# Patient Record
Sex: Male | Born: 1945 | Race: White | Hispanic: No | Marital: Married
Health system: Midwestern US, Community
[De-identification: ages and names within clinical notes are randomized; demographics above are authoritative.]

## PROBLEM LIST (undated history)

## (undated) DIAGNOSIS — I480 Paroxysmal atrial fibrillation: Principal | ICD-10-CM

## (undated) DIAGNOSIS — I4821 Permanent atrial fibrillation: Principal | ICD-10-CM

## (undated) DIAGNOSIS — Z79899 Other long term (current) drug therapy: Secondary | ICD-10-CM

## (undated) DIAGNOSIS — E7800 Pure hypercholesterolemia, unspecified: Secondary | ICD-10-CM

## (undated) DIAGNOSIS — Z136 Encounter for screening for cardiovascular disorders: Secondary | ICD-10-CM

## (undated) DIAGNOSIS — Z0000 Encounter for general adult medical examination without abnormal findings: Secondary | ICD-10-CM

---

## 2014-06-18 ENCOUNTER — Encounter

## 2014-06-23 ENCOUNTER — Encounter

## 2014-06-23 LAB — TSH: TSH: 2.14 (ref 0.358–3.74)

## 2014-06-23 LAB — COMPREHENSIVE METABOLIC PANEL
ALT: 29 (ref 12–78)
AST: 27 (ref 0–31)
Albumin,Serum: 4.2 (ref 3.4–5.0)
Albumin/Globulin Ratio: 1.3 (ref 1.0–2.5)
Alkaline Phosphatase: 79 (ref 45–117)
Anion Gap: 5 — ABNORMAL LOW (ref 6–16)
BUN/Creatinine Ratio: 17.1 (ref 6.0–20.0)
BUN: 15 (ref 7–25)
CO2: 31 (ref 21–31)
Calcium: 9.2 (ref 8.2–10.5)
Chloride: 102 (ref 98–109)
Creatinine: 0.88 (ref 0.60–1.50)
GFR African American: >60
GFR Non-African American: >60
Globulin: 3.3 (ref 2.4–4.1)
Glucose: 102 — ABNORMAL HIGH (ref 70–100)
Potassium: 5 (ref 3.5–5.0)
Sodium: 138 (ref 135–145)
Total Bilirubin: 0.6 (ref 0.3–1.0)
Total Protein: 7.5 (ref 6.4–8.3)

## 2014-06-23 LAB — LIPID PANEL
Cholesterol, Total: 194 (ref 100–199)
HDL: 60 (ref 40–59)
LDL Calculated: 118 (ref 0–129)
Triglycerides: 80 (ref 40–149)

## 2014-06-23 LAB — PSA SCREENING: PSA: 0.75 (ref 0.00–4.00)

## 2014-06-23 LAB — SEDIMENTATION RATE: Sed Rate: 22 — ABNORMAL HIGH (ref 0–10)

## 2014-06-23 NOTE — Progress Notes (Signed)
SUBJECTIVE:    George Ferguson   68 y.o.   male    Chief Complaint   Patient presents with   ??? Annual Exam     Wants left foot checked. Pt states he does not need refills today.        HPI    JXB:JYNWGNHPI:Annual wellness visit.     Past Medical History   Diagnosis Date   ??? Hearing aid consultation      Due to otoscleosis,AS   ??? Atrial fibrillation (HCC)    ??? Gout    ??? Pure hypercholesterolemia    ??? Diverticulosis    ??? DJD (degenerative joint disease)      knees      Family History   Problem Relation Age of Onset   ??? High Blood Pressure Mother    ??? Stroke Mother    ??? Cancer Father    ??? Heart Disease Sister       History     Social History   ??? Marital Status: Married     Spouse Name: N/A     Number of Children: N/A   ??? Years of Education: N/A     Occupational History   ??? Not on file.     Social History Main Topics   ??? Smoking status: Former Smoker   ??? Smokeless tobacco: Not on file   ??? Alcohol Use: Not on file   ??? Drug Use: Not on file   ??? Sexual Activity: Not on file     Other Topics Concern   ??? Not on file     Social History Narrative     Past Surgical History   Procedure Laterality Date   ??? Colonoscopy  2012     No Known Allergies   Immunization History   Administered Date(s) Administered   ??? Pneumococcal Polysaccharide (Pneumovax23) 06/13/2010        Review of Systems   Constitutional: Negative for activity change, appetite change and fatigue.   HENT: Negative for congestion, ear pain, hearing loss, sinus pressure, sore throat and trouble swallowing.    Eyes: Negative for discharge, redness and visual disturbance.   Respiratory: Negative for apnea, cough, shortness of breath and wheezing.    Cardiovascular: Negative for chest pain, palpitations and leg swelling.   Gastrointestinal: Negative for nausea, vomiting, abdominal pain, diarrhea, constipation, blood in stool and abdominal distention.   Endocrine: Negative for cold intolerance, heat intolerance, polydipsia, polyphagia and polyuria.   Genitourinary: Negative for  dysuria, urgency, frequency and hematuria.   Musculoskeletal: Negative for myalgias, back pain, joint swelling and arthralgias.   Skin: Negative for color change, rash and wound.   Allergic/Immunologic: Negative.    Neurological: Negative for dizziness, tremors, light-headedness, numbness and headaches.   Hematological: Negative for adenopathy. Does not bruise/bleed easily.   Psychiatric/Behavioral: Negative for confusion, sleep disturbance and dysphoric mood. The patient is not nervous/anxious.        OBJECTIVE:  BP 151/82    Pulse 61    Ht 5\' 8"  (1.727 m)    Wt 180 lb (81.647 kg)    BMI 27.38 kg/m2      Physical Exam   Constitutional: He is oriented to person, place, and time. He appears well-developed and well-nourished.   HENT:   Head: Normocephalic.   Right Ear: External ear normal.   Left Ear: External ear normal.   Nose: Nose normal.   Mouth/Throat: Oropharynx is clear and moist.   Eyes: Conjunctivae and EOM  are normal. Pupils are equal, round, and reactive to light. No scleral icterus.   Neck: Normal range of motion. Neck supple. No JVD present. No tracheal deviation present. No thyromegaly present.   Cardiovascular: Normal rate, regular rhythm, normal heart sounds and intact distal pulses.  Exam reveals no friction rub.    No murmur heard.  Pulmonary/Chest: Effort normal and breath sounds normal. No respiratory distress. He has no wheezes. He has no rales.   Abdominal: Soft. Bowel sounds are normal. He exhibits no distension and no mass. There is no tenderness.   Musculoskeletal: Normal range of motion. He exhibits no edema or tenderness.   Lymphadenopathy:     He has no cervical adenopathy.   Neurological: He is alert and oriented to person, place, and time. He displays normal reflexes. No cranial nerve deficit. He exhibits normal muscle tone. Coordination normal.   Skin: Skin is warm and dry. No rash noted. No pallor.   Psychiatric: He has a normal mood and affect. His behavior is normal. Judgment and  thought content normal.   Nursing note and vitals reviewed.    No results found for this basename: lipds, cmp, tsh, psa         ASSESSMENT:    No diagnosis found.       PLAN:    No orders of the defined types were placed in this encounter.     No orders of the defined types were placed in this encounter.      No Follow-up on file.

## 2015-03-28 ENCOUNTER — Ambulatory Visit: Admit: 2015-03-28 | Discharge: 2015-03-28 | Payer: MEDICARE | Attending: Family

## 2015-03-28 DIAGNOSIS — J0110 Acute frontal sinusitis, unspecified: Secondary | ICD-10-CM

## 2015-03-28 MED ORDER — AMOXICILLIN-POT CLAVULANATE 875-125 MG PO TABS
875-125 MG | ORAL_TABLET | Freq: Two times a day (BID) | ORAL | Status: AC
Start: 2015-03-28 — End: 2015-04-07

## 2015-03-28 NOTE — Progress Notes (Signed)
Subjective:      Patient ID: George DallyJames M Ferguson is a 69 y.o. male.    HPI Here w/ c/o sinus pressure and dng, headache, fatigue, pressure in forehead and behind eyes, bloody mucousy dng from nose, sl cough, poor sleep.     Review of Systems    Objective:   Physical Exam   Constitutional: He appears well-developed and well-nourished.   HENT:   Right Ear: External ear normal.   Left Ear: External ear normal.   Mouth/Throat: Oropharynx is clear and moist.   Frontal sinus tender   Eyes: Conjunctivae are normal.   Neck: Neck supple.   Cardiovascular: Normal heart sounds.    Pulmonary/Chest: Effort normal and breath sounds normal.   Lymphadenopathy:     He has no cervical adenopathy.       Assessment:      1. Acute frontal sinusitis, recurrence not specified     2. Chronic atrial fibrillation (HCC)               Plan:     Orders Placed This Encounter   Medications   ??? amoxicillin-clavulanate (AUGMENTIN) 875-125 MG per tablet     Sig: Take 1 tablet by mouth 2 times daily for 10 days     Dispense:  20 tablet     Refill:  0     Visit Coumadin clinic [facility] end of week due to atbx use and coumadin.

## 2015-04-14 LAB — EJECTION FRACTION PERCENTAGE: Left Ventricular Ejection Fraction: 70 %

## 2015-04-28 MED ORDER — GELATIN ABSORBABLE 100 CM EX MISC
100 CM | CUTANEOUS | Status: DC
Start: 2015-04-28 — End: 2015-06-27

## 2015-04-28 NOTE — Telephone Encounter (Signed)
Pt's wife left message stating when pt bit his tongue a while ago and it wouldn't stop bleeding they were given Gelfoam. She states they have run out of the Gelfoam and would like a rx sent to [facility] to have on hand.   Not sure how to put that in medications.

## 2015-06-27 ENCOUNTER — Ambulatory Visit: Admit: 2015-06-27 | Discharge: 2015-06-27 | Payer: MEDICARE | Attending: Family Medicine

## 2015-06-27 ENCOUNTER — Encounter

## 2015-06-27 DIAGNOSIS — Z0000 Encounter for general adult medical examination without abnormal findings: Secondary | ICD-10-CM

## 2015-06-27 LAB — LIPID PANEL
Chol/HDL Ratio: 3 NA
Cholesterol: 219 mg/dL — AB (ref <200)
HDL: 74 mg/dL — ABNORMAL HIGH (ref 40–59)
LDL Cholesterol: 133 mg/dL — AB (ref <100)
Triglycerides: 58 mg/dL (ref <150)

## 2015-06-27 LAB — PSA SCREENING: PSA: 0.629 ng/mL (ref <4.000)

## 2015-06-27 LAB — TSH: TSH: 1.59 uU/mL (ref 0.358–3.740)

## 2015-06-27 MED ORDER — COLCHICINE 0.6 MG PO TABS
0.6 MG | ORAL_TABLET | ORAL | 3 refills | Status: DC
Start: 2015-06-27 — End: 2016-07-13

## 2015-06-27 NOTE — Progress Notes (Signed)
SUBJECTIVE:    George Ferguson   69 y.o.   male    Chief Complaint   Patient presents with   ??? Annual Exam     colonoscopy 2015   ??? Gout     woke up with rt big toe in pain and swollen/discuss Allopurinol script        HPI    HPI: Annual wellness visit.    Past Medical History   Diagnosis Date   ??? Atrial fibrillation (HCC)    ??? Diverticulosis    ??? DJD (degenerative joint disease)      knees   ??? Gout    ??? Hearing aid consultation      Due to otoscleosis,AS   ??? Pure hypercholesterolemia       Family History   Problem Relation Age of Onset   ??? High Blood Pressure Mother    ??? Stroke Mother    ??? Cancer Father    ??? Heart Disease Sister       Social History     Social History   ??? Marital status: Married     Spouse name: N/A   ??? Number of children: N/A   ??? Years of education: N/A     Occupational History   ??? Not on file.     Social History Main Topics   ??? Smoking status: Former Smoker   ??? Smokeless tobacco: Not on file   ??? Alcohol use Not on file   ??? Drug use: Not on file   ??? Sexual activity: Not on file     Other Topics Concern   ??? Not on file     Social History Narrative     Past Surgical History   Procedure Laterality Date   ??? Colonoscopy  2012     No Known Allergies   Immunization History   Administered Date(s) Administered   ??? Pneumococcal 13-valent Conjugate (Prevnar13) 06/23/2014   ??? Pneumococcal Polysaccharide (Pneumovax23) 06/13/2010, 06/13/2010   ??? Td 05/10/2005   ??? Tdap (Boostrix, Adacel) 06/27/2015   ??? Zoster 06/26/2010        Current Outpatient Prescriptions:   ???  colchicine (COLCRYS) 0.6 MG tablet, 1 bid, Disp: 60 tablet, Rfl: 3  ???  warfarin (COUMADIN) 4 MG tablet, 4 mg 6 dys a week and 6 mg once a week, Disp: , Rfl:   ???  aspirin 81 MG tablet, Take 81 mg by mouth daily, Disp: , Rfl:   ???  diltiazem (DILACOR XR) 180 MG XR capsule, Take 180 mg by mouth daily, Disp: , Rfl:   ???  digoxin (LANOXIN) 125 MCG tablet, Take 125 mcg by mouth daily, Disp: , Rfl:   ???  Glucosamine 500 MG CAPS, Take by mouth daily , Disp: ,  Rfl:   ???  Milk Thistle 175 MG CAPS, Take by mouth daily , Disp: , Rfl:     Review of Systems   Constitutional: Negative for activity change, appetite change and fatigue.   HENT: Negative for congestion, ear pain, hearing loss, sinus pressure, sore throat and trouble swallowing.    Eyes: Negative for discharge, redness and visual disturbance.   Respiratory: Negative for apnea, cough, shortness of breath and wheezing.    Cardiovascular: Negative for chest pain, palpitations and leg swelling.   Gastrointestinal: Negative for abdominal distention, abdominal pain, blood in stool, constipation, diarrhea, nausea and vomiting.   Endocrine: Negative for cold intolerance, heat intolerance, polydipsia, polyphagia and polyuria.   Genitourinary: Negative for  dysuria, frequency, hematuria and urgency.   Musculoskeletal: Negative for arthralgias, back pain, joint swelling and myalgias.   Skin: Negative for color change, rash and wound.   Allergic/Immunologic: Negative.    Neurological: Negative for dizziness, tremors, light-headedness, numbness and headaches.   Hematological: Negative for adenopathy. Does not bruise/bleed easily.   Psychiatric/Behavioral: Negative for confusion, dysphoric mood and sleep disturbance. The patient is not nervous/anxious.        OBJECTIVE:  Visit Vitals   ??? BP (!) 161/96   ??? Pulse 66   ??? Temp 98.7 ??F (37.1 ??C)   ??? Ht 5' 7.5" (1.715 m)   ??? Wt 182 lb (82.6 kg)   ??? BMI 28.08 kg/m2      Physical Exam   Constitutional: He is oriented to person, place, and time. He appears well-developed and well-nourished.   HENT:   Head: Normocephalic.   Right Ear: External ear normal.   Left Ear: External ear normal.   Nose: Nose normal.   Mouth/Throat: Oropharynx is clear and moist.   Eyes: Conjunctivae and EOM are normal. Pupils are equal, round, and reactive to light. No scleral icterus.   Neck: Normal range of motion. Neck supple. No JVD present. No tracheal deviation present. No thyromegaly present.    Cardiovascular: Normal rate, regular rhythm, normal heart sounds and intact distal pulses.  Exam reveals no friction rub.    No murmur heard.  Pulmonary/Chest: Effort normal and breath sounds normal. No respiratory distress. He has no wheezes. He has no rales.   Abdominal: Soft. Bowel sounds are normal. He exhibits no distension and no mass. There is no tenderness.   Musculoskeletal: Normal range of motion. He exhibits no edema or tenderness.   Lymphadenopathy:     He has no cervical adenopathy.   Neurological: He is alert and oriented to person, place, and time. He displays normal reflexes. No cranial nerve deficit. He exhibits normal muscle tone. Coordination normal.   Skin: Skin is warm and dry. No rash noted. No pallor.   Psychiatric: He has a normal mood and affect. His behavior is normal. Judgment and thought content normal.   Nursing note and vitals reviewed.    TSH   Date Value Ref Range Status   06/23/2014 2.14 0.358 - 3.74 Final     Comment:     Test performed at: [facility], [address] [place], [place] [id number]     PSA   Date Value Ref Range Status   06/23/2014 0.75 0.00 - 4.00 Final     Comment:     PSA INFO  Methodology: [employer] immunoassay.  PSA results obtained by different assay methods should not be  used interchangeably.  PSA levels should not be interpreted as absolute evidence of  disease.  PSA levels should be interpreted in conjunction with other  clinical and physical tests.  Test performed at: [facility], [address] [place], [place] [id number]           ASSESSMENT:    1. Annual physical exam    2. Gastroesophageal reflux disease, esophagitis presence not specified    3. Idiopathic gout, unspecified chronicity, unspecified site    4. Paroxysmal atrial fibrillation (HCC)    5. Cirrhosis of liver without ascites, unspecified hepatic cirrhosis type (HCC)    6. Pure hypercholesterolemia    7. Encounter for long-term (current) use of medications    8. Screening for prostate cancer    9.  Need for Tdap vaccination  PLAN:    Orders Placed This Encounter   Medications   ??? colchicine (COLCRYS) 0.6 MG tablet     Sig: 1 bid     Dispense:  60 tablet     Refill:  3     Orders Placed This Encounter   Procedures   ??? Tdap vaccine greater than or equal to 7yo IM   ??? Lipid Panel   ??? Psa screening   ??? TSH without Reflex      Return in about 1 year (around 06/26/2016).

## 2016-06-27 ENCOUNTER — Ambulatory Visit: Admit: 2016-06-27 | Discharge: 2016-06-27 | Payer: MEDICARE | Attending: Family Medicine

## 2016-06-27 DIAGNOSIS — Z0000 Encounter for general adult medical examination without abnormal findings: Secondary | ICD-10-CM

## 2016-06-27 LAB — TSH: TSH: 1.52 uU/mL (ref 0.358–3.740)

## 2016-06-27 LAB — LIPID PANEL
Chol/HDL Ratio: 3 NA
Cholesterol: 208 mg/dL — AB (ref <200)
HDL: 68 mg/dL — ABNORMAL HIGH (ref 40–59)
LDL Cholesterol: 126 mg/dL — AB (ref <100)
Triglycerides: 71 mg/dL (ref <150)

## 2016-06-27 LAB — PSA SCREENING: PSA: 0.879 ng/mL (ref <4.000)

## 2016-06-27 NOTE — Progress Notes (Signed)
SUBJECTIVE:    George Ferguson   70 y.o.   male    Chief Complaint   Patient presents with   ??? Annual Exam     Fasting for labs.         HPI    HPI: Annual wellness visit.    Past Medical History:   Diagnosis Date   ??? Atrial fibrillation (HCC)    ??? Diverticulosis    ??? DJD (degenerative joint disease)     knees   ??? Gout    ??? Hearing aid consultation     Due to otoscleosis,AS   ??? Pure hypercholesterolemia       Family History   Problem Relation Age of Onset   ??? High Blood Pressure Mother    ??? Stroke Mother    ??? Cancer Father    ??? Heart Disease Sister       Social History     Social History   ??? Marital status: Married     Spouse name: N/A   ??? Number of children: N/A   ??? Years of education: N/A     Occupational History   ??? Not on file.     Social History Main Topics   ??? Smoking status: Former Smoker   ??? Smokeless tobacco: Not on file   ??? Alcohol use Not on file   ??? Drug use: Not on file   ??? Sexual activity: Not on file     Other Topics Concern   ??? Not on file     Social History Narrative     Past Surgical History:   Procedure Laterality Date   ??? COLONOSCOPY  2012     No Known Allergies   Immunization History   Administered Date(s) Administered   ??? Pneumococcal 13-valent Conjugate (Prevnar13) 06/23/2014   ??? Pneumococcal Polysaccharide (Pneumovax23) 06/13/2010   ??? Td 05/10/2005   ??? Tdap (Boostrix, Adacel) 06/27/2015   ??? Zoster 06/26/2010        Current Outpatient Prescriptions:   ???  colchicine (COLCRYS) 0.6 MG tablet, 1 bid, Disp: 60 tablet, Rfl: 3  ???  warfarin (COUMADIN) 4 MG tablet, 4 mg 6 dys a week and 6 mg once a week, Disp: , Rfl:   ???  aspirin 81 MG tablet, Take 81 mg by mouth daily, Disp: , Rfl:   ???  diltiazem (DILACOR XR) 180 MG XR capsule, Take 180 mg by mouth daily, Disp: , Rfl:   ???  digoxin (LANOXIN) 125 MCG tablet, Take 125 mcg by mouth daily, Disp: , Rfl:   ???  Glucosamine 500 MG CAPS, Take by mouth daily , Disp: , Rfl:   ???  Milk Thistle 175 MG CAPS, Take by mouth daily , Disp: , Rfl:     Review of Systems    Constitutional: Negative for activity change, appetite change and fatigue.   HENT: Negative for congestion, ear pain, hearing loss, sinus pressure, sore throat and trouble swallowing.    Eyes: Negative for discharge, redness and visual disturbance.   Respiratory: Negative for apnea, cough, shortness of breath and wheezing.    Cardiovascular: Negative for chest pain, palpitations and leg swelling.   Gastrointestinal: Negative for abdominal distention, abdominal pain, blood in stool, constipation, diarrhea, nausea and vomiting.   Endocrine: Negative for cold intolerance, heat intolerance, polydipsia, polyphagia and polyuria.   Genitourinary: Negative for dysuria, frequency, hematuria and urgency.   Musculoskeletal: Negative for arthralgias, back pain, joint swelling and myalgias.   Skin: Negative  for color change, rash and wound.   Allergic/Immunologic: Negative.    Neurological: Negative for dizziness, tremors, light-headedness, numbness and headaches.   Hematological: Negative for adenopathy. Does not bruise/bleed easily.   Psychiatric/Behavioral: Negative for confusion, dysphoric mood and sleep disturbance. The patient is not nervous/anxious.        OBJECTIVE:  BP (!) 145/81   Pulse 76   Ht 5\' 8"  (1.727 m)   Wt 176 lb (79.8 kg)   BMI 26.76 kg/m2   Physical Exam   Constitutional: He is oriented to person, place, and time. He appears well-developed and well-nourished.   HENT:   Head: Normocephalic.   Right Ear: External ear normal.   Left Ear: External ear normal.   Nose: Nose normal.   Mouth/Throat: Oropharynx is clear and moist.   Eyes: Conjunctivae and EOM are normal. Pupils are equal, round, and reactive to light. No scleral icterus.   Neck: Normal range of motion. Neck supple. No JVD present. No tracheal deviation present. No thyromegaly present.   Cardiovascular: Normal rate, regular rhythm, normal heart sounds and intact distal pulses.  Exam reveals no friction rub.    No murmur heard.  Pulmonary/Chest:  Effort normal and breath sounds normal. No respiratory distress. He has no wheezes. He has no rales.   Abdominal: Soft. Bowel sounds are normal. He exhibits no distension and no mass. There is no tenderness.   Musculoskeletal: Normal range of motion. He exhibits no edema or tenderness.   Lymphadenopathy:     He has no cervical adenopathy.   Neurological: He is alert and oriented to person, place, and time. He displays normal reflexes. No cranial nerve deficit. He exhibits normal muscle tone. Coordination normal.   Skin: Skin is warm and dry. No rash noted. No pallor.   Psychiatric: He has a normal mood and affect. His behavior is normal. Judgment and thought content normal.   Nursing note and vitals reviewed.    TSH   Date Value Ref Range Status   06/27/2015 1.590 0.358 - 3.740 uU/mL Final     PSA   Date Value Ref Range Status   06/27/2015 0.629 <4.000 ng/mL Final         ASSESSMENT:    1. Annual physical exam    2. Screening for prostate cancer    3. Encounter for long-term (current) use of medications    4. Gastroesophageal reflux disease, esophagitis presence not specified    5. Idiopathic gout, unspecified chronicity, unspecified site    6. Paroxysmal atrial fibrillation (HCC)    7. Pure hypercholesterolemia    8. Screening for AAA (abdominal aortic aneurysm)           PLAN:    Goals     None          No orders of the defined types were placed in this encounter.    Orders Placed This Encounter   Procedures   ??? US ABDOMINAL AORTA LIMITED   ??? Lipid Panel   ??? Psa screening   ??? TSH without Reflex   ??? [facility] Marcy SirenMedina- Ottorino Costantini, MD      Return in about 1 year (around 06/27/2017).

## 2016-06-27 NOTE — Telephone Encounter (Signed)
[facility] cs/carol asking for u/s aorta to be faxed/hand faxed to [phone]

## 2016-07-02 ENCOUNTER — Encounter

## 2016-07-13 ENCOUNTER — Ambulatory Visit: Admit: 2016-07-13 | Discharge: 2016-07-13 | Payer: MEDICARE | Attending: Clinical Cardiac Electrophysiology

## 2016-07-13 DIAGNOSIS — I4821 Permanent atrial fibrillation: Secondary | ICD-10-CM

## 2016-07-29 MED ORDER — DIGOXIN 125 MCG PO TABS
125 | ORAL_TABLET | Freq: Every day | ORAL | 3 refills | Status: DC
Start: 2016-07-29 — End: 2017-04-16

## 2016-07-29 MED ORDER — DILTIAZEM HCL ER 180 MG PO CP24
180 | ORAL_CAPSULE | Freq: Every day | ORAL | 3 refills | Status: DC
Start: 2016-07-29 — End: 2017-10-02

## 2016-07-29 MED ORDER — WARFARIN SODIUM 6 MG PO TABS
6 | ORAL_TABLET | ORAL | 3 refills | Status: DC
Start: 2016-07-29 — End: 2017-06-18

## 2016-07-29 MED ORDER — WARFARIN SODIUM 4 MG PO TABS
4 | ORAL_TABLET | ORAL | 5 refills | Status: DC | PRN
Start: 2016-07-29 — End: 2016-09-26

## 2016-07-29 NOTE — Progress Notes (Signed)
[facility]   Cardiology/Electrophysiology New Patient Clinic Note                   Chief Complaint:  Chief Complaint   Patient presents with   . Establish Cardiologist     Atrial fibrillation-Former Dr. Theodoro Gristho      History of Present Illness:   George Ferguson is a 70 y.o. male referred here by Dr Crissie ReeseSurso to establish care for permanent atrial fibrillation. He has a history of alcoholc cirrhosis and has had atrial fibrillation since for several years with a controlled VR. He is doing well and denies SOB, chest pain or palpitation. He was evaluated by Dr Vallery SaArruda in the past and never did undergo an RFA of AFib, probably because he had no symptoms.    Cardiac Tests reviewed by me:  ECG Today:   Atrial Fibrillation with a controlled VR in the 60s  Last Echo April 2015: LVEF 55%  Last stress test May 2016: No ischemia EF 70%  Last cardiac catheterization: 2006 with minimal plaque.        Past Medical History:  Past Medical History:   Diagnosis Date   . Atrial fibrillation (HCC)    . Cirrhosis (HCC)    . Diverticulosis    . DJD (degenerative joint disease)     knees   . Gout    . Hearing aid consultation     Due to otoscleosis,AS   . Hypertension    . Pure hypercholesterolemia       Past Surgical History  Past Surgical History:   Procedure Laterality Date   . COLONOSCOPY  2012   . DIAGNOSTIC CARDIAC CATH LAB PROCEDURE  06/25/2005    Small coronary anatomy     Family History  Family History   Problem Relation Age of Onset   . High Blood Pressure Mother    . Stroke Mother    . Cancer Father    . Heart Disease Sister       Social History  Social History   Substance Use Topics   . Smoking status: Former Games developermoker   . Smokeless tobacco: Never Used   . Alcohol use Yes      Comment: occasional wine      Medications:  Current Outpatient Prescriptions   Medication Sig Dispense Refill   . warfarin (COUMADIN) 6 MG tablet Take 1 tablet by mouth once a week 12 tablet 3   . warfarin (COUMADIN) 4 MG tablet Take 1 tablet  by mouth continuous prn (see instructions) Indications: 6 days a week 30 tablet 5   . diltiazem (DILACOR XR) 180 MG extended release capsule Take 1 capsule by mouth daily 90 capsule 3   . digoxin (LANOXIN) 125 MCG tablet Take 1 tablet by mouth daily 90 tablet 3   . omeprazole (PRILOSEC) 20 MG delayed release capsule Take 20 mg by mouth Daily      . aspirin 81 MG tablet Take 81 mg by mouth daily     . Glucosamine 500 MG CAPS Take by mouth daily      . Milk Thistle 175 MG CAPS Take by mouth daily        No current facility-administered medications for this visit.        Allergies:   Reviewed    Review of Systems:  All other systems were reviewed and are negative other than as noted in the HPI.      Physical Examination:  Vitals: Blood pressure (!) 150/82, pulse 60, height 5\' 8"  (1.727 m), weight 183 lb (83 kg), SpO2 98 %.    Constitutional: Appears well kept and looks stated age; in NAD  Psychiatric: A &O x3 Mood is normal; Affect is appropriate   Musculoskeletal: Normocephalic; no joint swelling; gait steady ; 5/5 muscle strength bilaterally in upper and lower extremities; no clubbing or cyanosis    HEENT: Pupils are equal and round; Conjunctiva are not injected; Sclera are non-icteric; Airway and Nares are patent; Ears without external abnormalities. Mucosa is pink; Dentition is normal  Neck: Supple; No JVD or Bruits; No thyromegaly; No lymphadenopathy   Respiratory: Lungs are clear with no rales or wheezes. Respiratory effort is normal and symmetrical bilaterally; Good air movement bilaterally  Heart: IRRR; Nl S1 and S2 no mcrg  Abdomen: NABS soft, non-tender, non-distended; no organomegaly; no obvious masses  Extremities/Skin: No LE edema; Skin warm to touch and well perfused; skin discoloration is absent; Peripheral Pulses intact  Neuro: sensation and motor function are grossly normal. Cranial Nerves are grossly intact    Laboratory Tests:     Lab Results   Component Value Date    NA 138 06/23/2014    K 5.0  06/23/2014    CL 102 06/23/2014    CO2 31 06/23/2014    BUN 15 06/23/2014    CREATININE 0.88 06/23/2014    GLUCOSE 102 06/23/2014    CALCIUM 9.2 06/23/2014      No results found for: LABA1C  No results found for: EAG  Lab Results   Component Value Date    TSH 1.520 06/27/2016     No results found for: BNP   Lab Results   Component Value Date    CHOL 208 (A) 06/27/2016    CHOL 219 (A) 06/27/2015    CHOL 194 06/23/2014     Lab Results   Component Value Date    TRIG 71 06/27/2016    TRIG 58 06/27/2015    TRIG 80 06/23/2014     Lab Results   Component Value Date    HDL 68 (H) 06/27/2016    HDL 74 (H) 06/27/2015    HDL 60 06/23/2014     Lab Results   Component Value Date    LDLCHOLESTEROL 126 (A) 06/27/2016    LDLCHOLESTEROL 133 (A) 06/27/2015    LDLCALC 118 06/23/2014       Radiology:   No results found.    Assessment and Plan:     1. Permanent Atrial Fibrillation. He has been asymptomatic with a normal LVEF. We spoke about switching from coumadin to NOAC, but he was comfortable with coumadin because of cost. Will continue current meds, thoug we can probably stop the digoxin in the future.    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 He is followed by Dr Raleigh CallasSharma.   3. HTN. A little high today. We could increase Cardizem if we had to. Will wait for now.     Waneta MartinsOttorino Eliyah Mcshea  DATE of SERVICE: 07/13/2016

## 2016-08-14 ENCOUNTER — Encounter

## 2016-08-14 LAB — PROTIME-INR
INR: 2.4
Protime: 28.6 seconds

## 2016-08-14 NOTE — Progress Notes (Signed)
Patient Name:  George Ferguson   DOB: 05/05/1946       Anticoagulation Episode Summary     Current INR goal 2.0-3.0    Next INR check 09/27/2016    INR from last check 2.4 (08/14/2016)    Weekly max dose 30 mg    Target end date 08/14/2017    INR check location     Preferred lab     [facility] remind[facility]        Comments          Anticoagulation Care Providers     Provider Role Specialty Phone number    George Martinsttorino Costantini, MD Referring Cardiac Electrophysiology [phone]            Dosing Plan as of 08/14/2016     Full instructions 6 mg on Tue; 4 mg all other days             Lab Results   Component Value Date    INR 2.4 08/14/2016    PROTIME 28.6 08/14/2016         Anticoagulation Complications: No change Dr Almond Lintostantini said that he can recheck the INR in  6 weeks

## 2016-08-14 NOTE — Telephone Encounter (Signed)
I called [facility] and told them that Dr Almond Lint will manage patients coumadin

## 2016-08-14 NOTE — Telephone Encounter (Signed)
Called stating that pt stated he is now having his coumadin manages by us.  Please call them to let them know either way

## 2016-09-26 ENCOUNTER — Encounter

## 2016-09-26 LAB — PROTIME-INR, FINGERSTICK
INR: 2.1 NA — ABNORMAL HIGH (ref 0.9–1.1)
PT, Bedside: 25.3 — ABNORMAL HIGH (ref 9.2–12.5)

## 2016-09-26 MED ORDER — WARFARIN SODIUM 4 MG PO TABS
4 | ORAL_TABLET | ORAL | 3 refills | Status: AC | PRN
Start: 2016-09-26 — End: ?

## 2016-09-26 NOTE — Progress Notes (Signed)
Patient Name:  Genene ChurnJames M Fabry   DOB: 10/08/1946       Anticoagulation Episode Summary     Current INR goal:   2.0-3.0   TTR:   100.0 % (1.1 mo)   Next INR check:   11/08/2016   INR from last check:   2.1 (09/26/2016)   Weekly max dose:   30 mg   Target end date:   08/14/2017   INR check location:      Preferred lab:      Send INR reminders to:   [facility]       Comments:            Anticoagulation Care Providers     Provider Role Specialty Phone number    Waneta Martinsttorino Costantini, MD Referring Cardiac Electrophysiology [phone]            Dosing Plan  As of 09/26/2016    TTR:   100.0 % (1.1 mo)   Full instructions:   6 mg on Tue; 4 mg all other days                Lab Results   Component Value Date    INR 2.1 (H) 09/26/2016    INR 2.4 08/14/2016    PROTIME 28.6 08/14/2016     Anticoagulation Complications: No change recheck in 6 weeks per Dr Almond Lintostantini

## 2016-11-07 LAB — PROTIME-INR
INR: 2.5
Protime: 25.1 seconds

## 2016-11-12 NOTE — Progress Notes (Signed)
Patient Name:  George ChurnJames M Ferguson   DOB: 08/22/1946       Anticoagulation Episode Summary     Current INR goal:   2.0-3.0   TTR:   100.0 % (2.5 mo)   Next INR check:   12/27/2016   INR from last check:   2.5 (11/07/2016)   Weekly max dose:   30 mg   Target end date:   08/14/2017   INR check location:      Preferred lab:      Send INR reminders to:   [facility]       Comments:            Anticoagulation Care Providers     Provider Role Specialty Phone number    Waneta Martinsttorino Costantini, MD Referring Cardiac Electrophysiology [phone]            Dosing Plan  As of 11/12/2016    TTR:   100.0 % (1.1 mo)   Full instructions:   6 mg on Tue; 4 mg all other days                Lab Results   Component Value Date    INR 2.5 11/07/2016    INR 2.1 (H) 09/26/2016    INR 2.4 08/14/2016    PROTIME 25.1 11/07/2016    PROTIME 28.6 08/14/2016         Anticoagulation Complications: no change recheck in 6 weeks per pt request

## 2016-12-19 LAB — PROTIME-INR
INR: 2.1
Protime: 21.4 seconds

## 2016-12-20 NOTE — Progress Notes (Signed)
Patient Name:  George Ferguson   DOB: 08/21/1946       Anticoagulation Episode Summary     Current INR goal:   2.0-3.0   TTR:   100.0 % (3.9 mo)   Next INR check:   01/15/2017   INR from last check:   2.1 (12/19/2016)   Weekly max dose:   30 mg   Target end date:   08/14/2017   INR check location:   Outside Lab   Preferred lab:      Send INR reminders to:   [facility]       Comments:            Anticoagulation Care Providers     Provider Role Specialty Phone number    Waneta Martinsttorino Costantini, MD Referring Cardiac Electrophysiology [phone]            Dosing Plan  As of 12/20/2016    TTR:   100.0 % (2.5 mo)   Full instructions:   6 mg on Tue; 4 mg all other days                Lab Results   Component Value Date    INR 2.1 12/19/2016    INR 2.5 11/07/2016    INR 2.1 (H) 09/26/2016    PROTIME 21.4 12/19/2016    PROTIME 25.1 11/07/2016    PROTIME 28.6 08/14/2016         Anticoagulation Complications: No change recheck in 1 month to 6 weeks per patient request

## 2017-01-30 ENCOUNTER — Encounter

## 2017-01-30 LAB — PROTIME-INR
INR: 2.4
Protime: 23.6 seconds

## 2017-01-30 NOTE — Telephone Encounter (Signed)
Pt needs a new standing Pt/INR order faxed to [facility] in [facility] @ [phone]

## 2017-01-30 NOTE — Telephone Encounter (Signed)
[facility] standing [facility]

## 2017-01-31 NOTE — Progress Notes (Signed)
Patient Name:  George Ferguson   DOB: 09/01/1946       Anticoagulation Episode Summary     Current INR goal:   2.0-3.0   TTR:   100.0 % (5.3 mo)   Next INR check:   02/28/2017   INR from last check:   2.4 (01/30/2017)   Weekly max dose:   30 mg   Target end date:   08/14/2017   INR check location:   Outside Lab   Preferred lab:      Send INR reminders to:   [facility]       Comments:            Anticoagulation Care Providers     Provider Role Specialty Phone number    Waneta Martinsttorino Costantini, MD Referring Cardiac Electrophysiology [phone]            Dosing Plan  As of 01/31/2017    TTR:   100.0 % (5.3 mo)   Full instructions:   6 mg on Tue; 4 mg all other days                Lab Results   Component Value Date    INR 2.4 01/30/2017    INR 2.1 12/19/2016    INR 2.5 11/07/2016    PROTIME 23.6 01/30/2017    PROTIME 21.4 12/19/2016    PROTIME 25.1 11/07/2016         Anticoagulation Complications: No change recheck INR in 1 month

## 2017-03-13 LAB — PROTIME-INR
INR: 2.6
Protime: 25.8 seconds

## 2017-03-18 NOTE — Progress Notes (Signed)
Patient Name:  George Ferguson   DOB: 12-Oct-1946       Anticoagulation Episode Summary     Current INR goal:   2.0-3.0   TTR:   100.0 % (6.7 mo)   Next INR check:   04/19/2017   INR from last check:   2.6 (03/13/2017)   Weekly max dose:   30 mg   Target end date:   08/14/2017   INR check location:   Outside Lab   Preferred lab:      Send INR reminders to:   [facility] CLINICAL STAFF       Comments:            Anticoagulation Care Providers     Provider Role Specialty Phone number    Waneta Martins, MD Referring Cardiac Electrophysiology [phone]            Dosing Plan  As of 03/18/2017    TTR:   100.0 % (6.7 mo)   Full instructions:   6 mg on Tue; 4 mg all other days                Lab Results   Component Value Date    INR 2.6 03/13/2017    INR 2.4 01/30/2017    INR 2.1 12/19/2016    PROTIME 25.8 03/13/2017    PROTIME 23.6 01/30/2017    PROTIME 21.4 12/19/2016         Anticoagulation Complications: No change recheck INR in 1 month

## 2017-04-16 ENCOUNTER — Ambulatory Visit: Admit: 2017-04-16 | Discharge: 2017-04-16 | Payer: MEDICARE | Attending: Clinical Cardiac Electrophysiology

## 2017-04-16 DIAGNOSIS — R0609 Other forms of dyspnea: Secondary | ICD-10-CM

## 2017-04-16 NOTE — Progress Notes (Signed)
[name]   Cardiology/Electrophysiology Follow-Up Clinic Note                   Chief Complaint:  Chief Complaint   Patient presents with   . Follow-up      History of Present Illness:   George Ferguson is a 71 y.o. male  who I met in August 2017 when he was referred here by Dr Myriam Jacobson to establish care for permanent AFib. He has a history of alcoholc cirrhosis and has had atrial fibrillation for several years with a controlled VR. He was evaluated by Dr Abner Greenspan at [facility] in the past and never did undergo an RFA of AFib, probably because he had no symptoms    He returns today feeling well.  He spends most of his winter in [place], and is actually selling his home to move down there permanently next fall. He is doing well and denies SOB at rest, chest pain or palpitation.  He does complain of some increased dyspnea with exertion, especially when climbing stairs, and some phlegm and cough when he wakes up in the morning.  On the other hand, he is still able to walk on a treadmill up to one hour.    He has done less of that recently because of an Achilles tendon injury. He reports blood pressures in the 027O to 536U systolic and heart rates in the 70s at home.    Past Medical History:  Past Medical History:   Diagnosis Date   . Atrial fibrillation ([place])    . Cirrhosis ([place])    . Diverticulosis    . DJD (degenerative joint disease)     knees   . Gout    . Hearing aid consultation     Due to otoscleosis,AS   . Hypertension    . Pure hypercholesterolemia       Past Surgical History  Past Surgical History:   Procedure Laterality Date   . COLONOSCOPY  2012   . DIAGNOSTIC CARDIAC CATH LAB PROCEDURE  06/25/2005    Small coronary anatomy     Family History  Family History   Problem Relation Age of Onset   . High Blood Pressure Mother    . Stroke Mother    . Cancer Father    . Heart Disease Sister       Social History  Social History   Substance Use Topics   . Smoking status: Former Research scientist (life sciences)   . Smokeless  tobacco: Never Used   . Alcohol use Yes      Comment: occasional wine      Medications:  Current Outpatient Prescriptions   Medication Sig Dispense Refill   . warfarin (COUMADIN) 4 MG tablet Take 1 tablet by mouth continuous prn (see instructions) 90 tablet 3   . warfarin (COUMADIN) 6 MG tablet Take 1 tablet by mouth once a week 12 tablet 3   . diltiazem (DILACOR XR) 180 MG extended release capsule Take 1 capsule by mouth daily 90 capsule 3   . omeprazole (PRILOSEC) 20 MG delayed release capsule Take 20 mg by mouth Daily      . aspirin 81 MG tablet Take 81 mg by mouth daily     . Glucosamine 500 MG CAPS Take by mouth daily      . Milk Thistle 175 MG CAPS Take by mouth daily        No current facility-administered medications for this visit.  Allergies:   Reviewed    Review of Systems:  All other systems were reviewed and are negative other than as noted in the HPI.      Physical Examination:  Vitals: Blood pressure 130/68, pulse 68, resp. rate 20, weight 188 lb (85.3 kg), SpO2 99 %.    Constitutional: Appears well kept and looks stated age; in NAD  Psychiatric: A &O x3 Mood is pleasant; Affect is appropriate   Musculoskeletal: Normocephalic; no joint swelling; gait steady ; 5/5 muscle strength bilaterally in upper and lower extremities; no clubbing or cyanosis    HEENT: Pupils are equal and round; Conjunctiva are not injected; Sclera are non-icteric; Airway and Nares are patent; Ears without external abnormalities. Mucosa is pink; Dentition is normal  Neck: Supple; No JVD or Bruits; No thyromegaly; No lymphadenopathy   Respiratory: Lungs are clear with no rales or wheezes. Respiratory effort is normal and symmetrical bilaterally; Good air movement bilaterally  Heart: IRRR; Nl S1 and S2 no mcrg  Abdomen: NABS soft, non-tender, non-distended; no organomegaly; no obvious masses  Extremities/Skin: No LE edema; Skin warm to touch and well perfused; skin discoloration is absent; Peripheral Pulses intact  Neuro:  sensation and motor function are grossly normal. Cranial Nerves are grossly intact    Laboratory Tests:     Lab Results   Component Value Date    NA 138 06/23/2014    K 5.0 06/23/2014    CL 102 06/23/2014    CO2 31 06/23/2014    BUN 15 06/23/2014    CREATININE 0.88 06/23/2014    GLUCOSE 102 06/23/2014    CALCIUM 9.2 06/23/2014      No results found for: LABA1C  No results found for: EAG  Lab Results   Component Value Date    TSH 1.520 06/27/2016     No results found for: BNP   Lab Results   Component Value Date    CHOL 208 (A) 06/27/2016    CHOL 219 (A) 06/27/2015    CHOL 194 06/23/2014     Lab Results   Component Value Date    TRIG 71 06/27/2016    TRIG 58 06/27/2015    TRIG 80 06/23/2014     Lab Results   Component Value Date    HDL 68 (H) 06/27/2016    HDL 74 (H) 06/27/2015    HDL 60 06/23/2014     Lab Results   Component Value Date    LDLCHOLESTEROL 126 (A) 06/27/2016    LDLCHOLESTEROL 133 (A) 06/27/2015    LDLCALC 118 06/23/2014     Radiology:   No results found.    Cardiac Tests reviewed by me:  ECG Today:   Atrial Fibrillation with a controlled VR in the 60s  Last Echo April 2015: LVEF 55%  Last stress test May 2016: No ischemia EF 70%  Last cardiac catheterization: 2006 with minimal plaque.    Assessment and Plan:     1. Permanent Atrial Fibrillation. He has been asymptomatic with a normal LVEF. We spoke about switching from coumadin to NOAC, but he was comfortable with coumadin because of cost.  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 He is followed by Dr Donneta Romberg.   3. HTN. A little high today. We could increase Cardizem if we had to. Will wait for now.  4.  Dyspnea on exertion: Today he complains  of some dyspnea which is out of the ordinary for him climbing steps and some phlegm and cough when he wakes up in the morning.  I will get an echocardiogram to make sure that his LV function is still  normal and that his pulmonary pressures are okay given his history of cirrhosis.  Brayton Caves  DATE of SERVICE: 04/16/2017

## 2017-04-24 ENCOUNTER — Inpatient Hospital Stay: Admit: 2017-04-24 | Attending: Clinical Cardiac Electrophysiology

## 2017-04-24 LAB — ECHOCARDIOGRAM COMPLETE 2D W DOPPLER W COLOR: Left Ventricular Ejection Fraction: 60

## 2017-04-24 LAB — EJECTION FRACTION PERCENTAGE: Left Ventricular Ejection Fraction: 60 %

## 2017-04-24 LAB — PROTIME-INR
INR: 2.5
Protime: 29.5 seconds

## 2017-04-24 MED ORDER — NORMAL SALINE FLUSH 0.9 % IV SOLN
0.9 % | INTRAVENOUS | Status: DC | PRN
Start: 2017-04-24 — End: 2017-04-25

## 2017-04-24 MED ORDER — PERFLUTREN LIPID MICROSPHERE IV SUSP
Freq: Once | INTRAVENOUS | Status: DC | PRN
Start: 2017-04-24 — End: 2017-04-25
  Administered 2017-04-24: 12:00:00 0.16 mL via INTRAVENOUS

## 2017-04-24 MED FILL — DEFINITY 6.52 MG/ML IV SUSP: 6.52 MG/ML | INTRAVENOUS | Qty: 2

## 2017-04-24 NOTE — Other (Unsigned)
Patient Acct Nbr: [id number]   Primary AUTH/CERT:   Primary Insurance Company Name: [employer]  Primary Insurance Plan name: Medicare A  Primary Insurance Group Number:   Primary Insurance Plan Type: [employer] A  Primary Insurance Policy Number: [id number]    Secondary AUTH/CERT:   Secondary Insurance Company Name: [employer]  Secondary Insurance Plan name: Medicare B  Secondary Insurance Group Number:   Secondary Insurance Plan Type: [employer] B  Secondary Insurance Policy Number: [id number]    Tertiary AUTH/CERT:   [employer]iary Insurance Company Name: [employer] [place]  [employer]iary Insurance Plan name: [facility] SuperMed  [employer]ertiary Insurance Group Number: [id number]  [employer]ertiary Insurance Plan Type: [employer]  Tertiary Insurance Policy Number: [id number]

## 2017-04-25 NOTE — Telephone Encounter (Signed)
Called patient and notified that Dr Almond Lintostantini reviewed his Echo and said that it looks OK ,patient would like a copy of Echo sent to his house ,will ask secretary to send

## 2017-04-25 NOTE — Telephone Encounter (Signed)
-----   Message from Waneta Martinsttorino Costantini, MD sent at 04/25/2017  3:41 PM EDT -----  Steward DroneBrenda please call and let the patient know that the echo looks OK.

## 2017-04-26 ENCOUNTER — Encounter

## 2017-04-26 NOTE — Telephone Encounter (Signed)
Patient called to say that he had an INR done on 04-24-17 but the result went to Dr Crissie ReeseSurso I called [facility] and sent a new standing order for INR per Dr Almond Lintostantini

## 2017-04-26 NOTE — Progress Notes (Signed)
Patient Name:  George Ferguson   DOB: 05/05/1946       Anticoagulation Episode Summary     Current INR goal:   2.0-3.0   TTR:   100.0 % (8.1 mo)   Next INR check:   05/27/2017   INR from last check:   2.5 (04/24/2017)   Weekly max warfarin dose:   30 mg   Target end date:   08/14/2017   INR check location:   Outside Lab   Preferred lab:      Send INR reminders [facility]:   [facility]       Comments:            Anticoagulation Care Providers     Provider Role Specialty Phone number    Waneta Martinsttorino Costantini, MD Referring Cardiac Electrophysiology [phone]            Dosing Plan  As of 04/26/2017    TTR:   100.0 % (6.7 mo)   Full warfarin instructions:   6 mg on Tue; 4 mg all other days                Lab Results   Component Value Date    INR 2.5 04/24/2017    INR 2.6 03/13/2017    INR 2.4 01/30/2017    PROTIME 29.5 04/24/2017    PROTIME 25.8 03/13/2017    PROTIME 23.6 01/30/2017         Anticoagulation Complications: No change recheck in 1 month

## 2017-06-05 ENCOUNTER — Encounter

## 2017-06-18 MED ORDER — WARFARIN SODIUM 6 MG PO TABS
6 | ORAL_TABLET | ORAL | 3 refills | Status: DC
Start: 2017-06-18 — End: 2017-06-20

## 2017-06-18 NOTE — Telephone Encounter (Signed)
Last OV with Dr Almond Lintostantini 04-16-2017 last INR was 06-05-2017 I have called the lab about this because it said that it is still in process

## 2017-06-18 NOTE — Telephone Encounter (Signed)
[facility] 6mg  [facility] [place]

## 2017-06-20 MED ORDER — WARFARIN SODIUM 6 MG PO TABS
6 | ORAL_TABLET | ORAL | 3 refills | Status: AC
Start: 2017-06-20 — End: ?

## 2017-06-20 NOTE — Addendum Note (Signed)
Addended by: Dionicia AblerMARTY, Shamanda Len on: 06/20/2017 03:28 PM     Modules accepted: Orders

## 2017-06-20 NOTE — Telephone Encounter (Signed)
See below

## 2017-06-20 NOTE — Telephone Encounter (Signed)
[facility] [place] [facility] in the previous note - please resend to correct pharmacy. Thanks

## 2017-07-02 ENCOUNTER — Encounter

## 2017-07-02 ENCOUNTER — Ambulatory Visit: Admit: 2017-07-02 | Discharge: 2017-07-02 | Payer: MEDICARE | Attending: Family Medicine

## 2017-07-02 DIAGNOSIS — Z0000 Encounter for general adult medical examination without abnormal findings: Secondary | ICD-10-CM

## 2017-07-02 LAB — BASIC METABOLIC PANEL
Anion Gap: 12 NA
BUN: 19 mg/dL (ref 7–20)
CO2: 28 mmol/L (ref 22–30)
Calcium: 10.3 mg/dL (ref 8.4–10.4)
Chloride: 100 mmol/L (ref 98–107)
Creatinine: 0.79 mg/dL (ref 0.52–1.25)
EGFR IF NonAfrican American: >60 mL/min (ref >60)
Glucose: 101 mg/dL — ABNORMAL HIGH (ref 70–100)
Potassium: 5.2 mmol/L — ABNORMAL HIGH (ref 3.5–5.1)
Sodium: 140 mmol/L (ref 137–145)
eGFR African American: >60 mL/min (ref >60)

## 2017-07-02 LAB — LIPID PANEL
Chol/HDL Ratio: 3 NA
Cholesterol: 204 mg/dL — AB (ref <200)
HDL: 66 mg/dL — ABNORMAL HIGH (ref 40–60)
LDL Cholesterol: 125 mg/dL — AB (ref <100)
Triglycerides: 67 mg/dL (ref <150)

## 2017-07-02 LAB — PSA SCREENING: PSA: 0.619 ng/mL (ref <4.000)

## 2017-07-02 LAB — TSH: TSH: 1.557 u[IU]/mL (ref 0.465–4.680)

## 2017-07-02 MED ORDER — ZOSTER VAC RECOMB ADJUVANTED 50 MCG/0.5ML IM SUSR
50 MCG/0.5ML | Freq: Once | INTRAMUSCULAR | 0 refills | Status: AC
Start: 2017-07-02 — End: 2017-07-02

## 2017-07-02 NOTE — Progress Notes (Signed)
Medicare Annual Wellness Visit - Subsequent    Name: George Ferguson Today???s Date: 07/02/2017   MRN: [id number] Sex: Male   Age: 71 y.o. Ethnicity: Non-Hispanic/Non Latino   DOB: Jun 23, 1946 Race: Damier Disano Weisner is here for   Chief Complaint   Patient presents with   ??? Medicare AWV     no voiced concerns/moving and requesting transfer of records-form given   ??? Referral - General     cardiology/GI        Screenings for behavioral, psychosocial and functional/safety risks, and cognitive dysfunction are all negative except as indicated below. These results, as well as other patient data from the Health Risk Assessment form, are documented in Flowsheets linked to this Encounter.    No Known Allergies    Prior to Visit Medications    Medication Sig Taking? Authorizing Provider   zoster recombinant adjuvanted vaccine (SHINGRIX) 50 MCG SUSR injection Inject 0.5 mLs into the muscle once for 1 dose Yes Duncan Dull, MD   warfarin (COUMADIN) 6 MG tablet Take 1 tablet by mouth once a week Yes Shelda Pal, APRN - CNP   warfarin (COUMADIN) 4 MG tablet Take 1 tablet by mouth continuous prn (see instructions) Yes Waneta Martins, MD   diltiazem (DILACOR XR) 180 MG extended release capsule Take 1 capsule by mouth daily Yes Waneta Martins, MD   omeprazole (PRILOSEC) 20 MG delayed release capsule Take 20 mg by mouth 2 times daily as needed  Yes Historical Provider, MD   aspirin 81 MG tablet Take 81 mg by mouth daily Yes Historical Provider, MD   Glucosamine 500 MG CAPS Take by mouth daily  Yes Historical Provider, MD   Milk Thistle 175 MG CAPS Take by mouth daily  Yes Historical Provider, MD         Past Medical History:   Diagnosis Date   ??? Atrial fibrillation ([facility])    ??? Cirrhosis ([facility])    ??? Diverticulosis    ??? DJD (degenerative joint disease)     knees   ??? Gout    ??? Hearing aid consultation     Due to otoscleosis,AS   ??? Hypertension    ??? Pure hypercholesterolemia          Past Surgical History:    Procedure Laterality Date   ??? COLONOSCOPY  2012   ??? DIAGNOSTIC CARDIAC CATH LAB PROCEDURE  06/25/2005    Small coronary anatomy       Family History   Problem Relation Age of Onset   ??? High Blood Pressure Mother    ??? Stroke Mother    ??? Cancer Father    ??? Heart Disease Sister        CareTeam (Including outside providers/suppliers regularly involved in providing care):   Patient Care Team:  Duncan Dull, MD as PCP - General    Wt Readings from Last 3 Encounters:   07/02/17 190 lb (86.2 kg)   04/16/17 188 lb (85.3 kg)   07/13/16 183 lb (83 kg)     Vitals:    07/02/17 0932 07/02/17 0940 07/02/17 1009   BP: (!) 172/101 (!) 180/60 138/88   Site: Right Arm Left Arm    Position: Sitting Sitting    Cuff Size: Medium Adult Medium Adult    Pulse: 94     Temp: 98.1 ??F (36.7 ??C)     Weight: 190 lb (86.2 kg)     Height: 5\' 7"  (1.702  m)         General Appearance: alert and oriented to person, place and time, well developed and well- nourished, in no acute distress  Skin: warm and dry, no rash or erythema  Head: normocephalic and atraumatic  Eyes: pupils equal, round, and reactive to light, extraocular eye movements intact, conjunctivae normal  ENT: tympanic membrane, external ear and ear canal normal bilaterally, nose without deformity, nasal mucosa and turbinates normal without polyps  Neck: supple and non-tender without mass, no thyromegaly or thyroid nodules, no cervical lymphadenopathy  Pulmonary/Chest: clear to auscultation bilaterally- no wheezes, rales or rhonchi, normal air movement, no respiratory distress  Cardiovascular: normal rate, regular rhythm, normal S1 and S2, no murmurs, rubs, clicks, or gallops, distal pulses intact, no carotid bruits  Abdomen: soft, non-tender, non-distended, normal bowel sounds, no masses or organomegaly  Extremities: no cyanosis, clubbing or edema  Musculoskeletal: normal range of motion, no joint swelling, deformity or tenderness  Neurologic: reflexes normal and symmetric, no  cranial nerve deficit, gait, coordination and speech normal    The following problems were reviewed today and where indicated follow up appointments were made and/or referrals ordered.    Risk Factor Screenings with Interventions     Fall Risk:  2 or more falls in past year?: no  Fall with injury in past year?: no  Fall Risk Interventions:    ?? None indicated    Depression:  PHQ-2 Score: 0  Depression Interventions:  ?? None indicated    Anxiety:     Anxiety Interventions:  ?? None indicated    Cognitive:     Cognitive Impairment Interventions:  ?? None indicated    Substance Abuse:  Social History     Social History Main Topics   ??? Smoking status: Former Smoker   ??? Smokeless tobacco: Never Used   ??? Alcohol use Yes      Comment: occasional wine   ??? Drug use: No   ??? Sexual activity: Not on file        Substance Abuse Interventions:  ?? None indicated    Health Risk Assessment:        General Health Risk Interventions:  ?? None indicated       Body mass index is 29.76 kg/m??.  Health Habits/Nutrition Interventions:  ?? None indicated       Hearing/Vision Interventions:  ?? None indicated       Safety Interventions:  ?? None indicated       ADL Interventions:  ?? None indicated    Personalized Preventive Plan   Current Health Maintenance Status  Immunization History   Administered Date(s) Administered   ??? Pneumococcal 13-valent Conjugate (Prevnar13) 06/23/2014   ??? Pneumococcal Polysaccharide (Pneumovax23) 06/13/2010   ??? Td 05/10/2005   ??? Tdap (Boostrix, Adacel) 06/27/2015   ??? Zoster Live (Zostavax) 06/26/2010        Health Maintenance   Topic Date Due   ??? Shingles Vaccine (1 of 2 - 2 Dose Series) 11/15/1994   ??? Flu vaccine (1) 08/10/2017   ??? Lipid screen  06/27/2021   ??? Colon cancer screen colonoscopy  08/02/2024   ??? DTaP/Tdap/Td vaccine (2 - Td) 06/26/2025   ??? Pneumococcal low/med risk  Completed   ??? AAA screen  Completed   ??? Hepatitis C screen  Completed       Recommendations for Preventive Services Due: see  orders.  Recommended screening schedule for the next 5-10 years is provided to the patient in written form:  see Patient Instructions/AVS.

## 2017-07-17 ENCOUNTER — Encounter

## 2017-07-17 ENCOUNTER — Ambulatory Visit

## 2017-07-17 DIAGNOSIS — I480 Paroxysmal atrial fibrillation: Secondary | ICD-10-CM

## 2017-07-17 LAB — PROTIME-INR, FINGERSTICK
INR: 3.1 NA — ABNORMAL HIGH (ref 0.9–1.1)
PT, Bedside: 37.5 — ABNORMAL HIGH (ref 9.2–12.5)

## 2017-07-17 NOTE — Telephone Encounter (Signed)
[place] moving to [place] and needs a standing order mailed to him before he moves

## 2017-07-17 NOTE — Telephone Encounter (Signed)
Will mail standing INR order to patients home

## 2017-07-17 NOTE — Progress Notes (Signed)
Patient Name:  George Ferguson   DOB: 01/06/1946       Anticoagulation Episode Summary     Current INR goal:   2.0-3.0   TTR:   95.7 % (10.9 mo)   Next INR check:   07/24/2017   INR from last check:   3.1! (07/17/2017)   Weekly max warfarin dose:   30 mg   Target end date:   08/14/2017   INR check location:   Outside Lab   Preferred lab:      Send INR reminders to:   [facility]       Comments:            Anticoagulation Care Providers     Provider Role Specialty Phone number    Waneta Martinsttorino Costantini, MD Referring Cardiac Electrophysiology [phone]            Dosing Plan  As of 07/17/2017    TTR:   95.7 % (10.9 mo)   Full warfarin instructions:   6 mg on Tue; 4 mg all other days                Lab Results   Component Value Date    INR 3.1 (H) 07/17/2017    INR 2.5 04/24/2017    INR 2.6 03/13/2017    PROTIME 29.5 04/24/2017    PROTIME 25.8 03/13/2017    PROTIME 23.6 01/30/2017         Anticoagulation Complications: Had a beer with dinner. No change in dose. He is in the process of moving to [place] but will be at his residence in [place] until August 30. I asked that he have his INR repeated in 2 weeks and then establish with a physician in [place] as soon as he is able. In the interim he will go to lab care and we can manage until he makes that transition. I also talked to him about possibly switching to Eliquis and he is not interested at this point.

## 2017-07-31 ENCOUNTER — Encounter

## 2017-07-31 LAB — PROTIME-INR, FINGERSTICK
INR: 2.8 NA — ABNORMAL HIGH (ref 0.9–1.1)
PT, Bedside: 33.4 — ABNORMAL HIGH (ref 9.2–12.5)

## 2017-07-31 NOTE — Progress Notes (Signed)
Patient Name:  George Ferguson   DOB: 27-Jan-1946       Anticoagulation Episode Summary     Current INR goal:   2.0-3.0   TTR:   94.5 % (11.4 mo)   Next INR check:   08/30/2017   INR from last check:   2.8 (07/31/2017)   Weekly max warfarin dose:   30 mg   Target end date:   08/14/2017   INR check location:   Outside Lab   Preferred lab:      Send INR reminders to:   [facility] CLINICAL STAFF       Comments:            Anticoagulation Care Providers     Provider Role Specialty Phone number    Waneta Martins, MD Referring Cardiac Electrophysiology [phone]            Dosing Plan  As of 07/31/2017    TTR:   94.5 % (11.4 mo)   Full warfarin instructions:   6 mg on Tue; 4 mg all other days                Lab Results   Component Value Date    INR 2.8 (H) 07/31/2017    INR 3.1 (H) 07/17/2017    INR 2.5 04/24/2017    PROTIME 29.5 04/24/2017    PROTIME 25.8 03/13/2017    PROTIME 23.6 01/30/2017         Anticoagulation Complications: No change recheck in 1 month

## 2017-10-02 MED ORDER — DILTIAZEM HCL ER 180 MG PO CP24
180 | ORAL_CAPSULE | Freq: Every day | ORAL | 3 refills | Status: AC
Start: 2017-10-02 — End: ?

## 2017-10-02 NOTE — Telephone Encounter (Signed)
Pt [facility] [place] - ph# [phone]

## 2017-10-02 NOTE — Telephone Encounter (Signed)
Last OV with Dr Almond Lintostantini 04-16-2017

## 2020-04-05 IMAGING — DX CHEST PA AND LATERAL
1 series · 2 of 2 positions shown · non-contrast
Comparison: none

CLINICAL INDICATION:  Shortness of breath. Smoking cessation in 5152. 
COMPARISON EXAMINATIONS: None prior.

[Series 1: PA · U · 0.14mm/px · 2 of 2 slices shown]
[im 1/2]
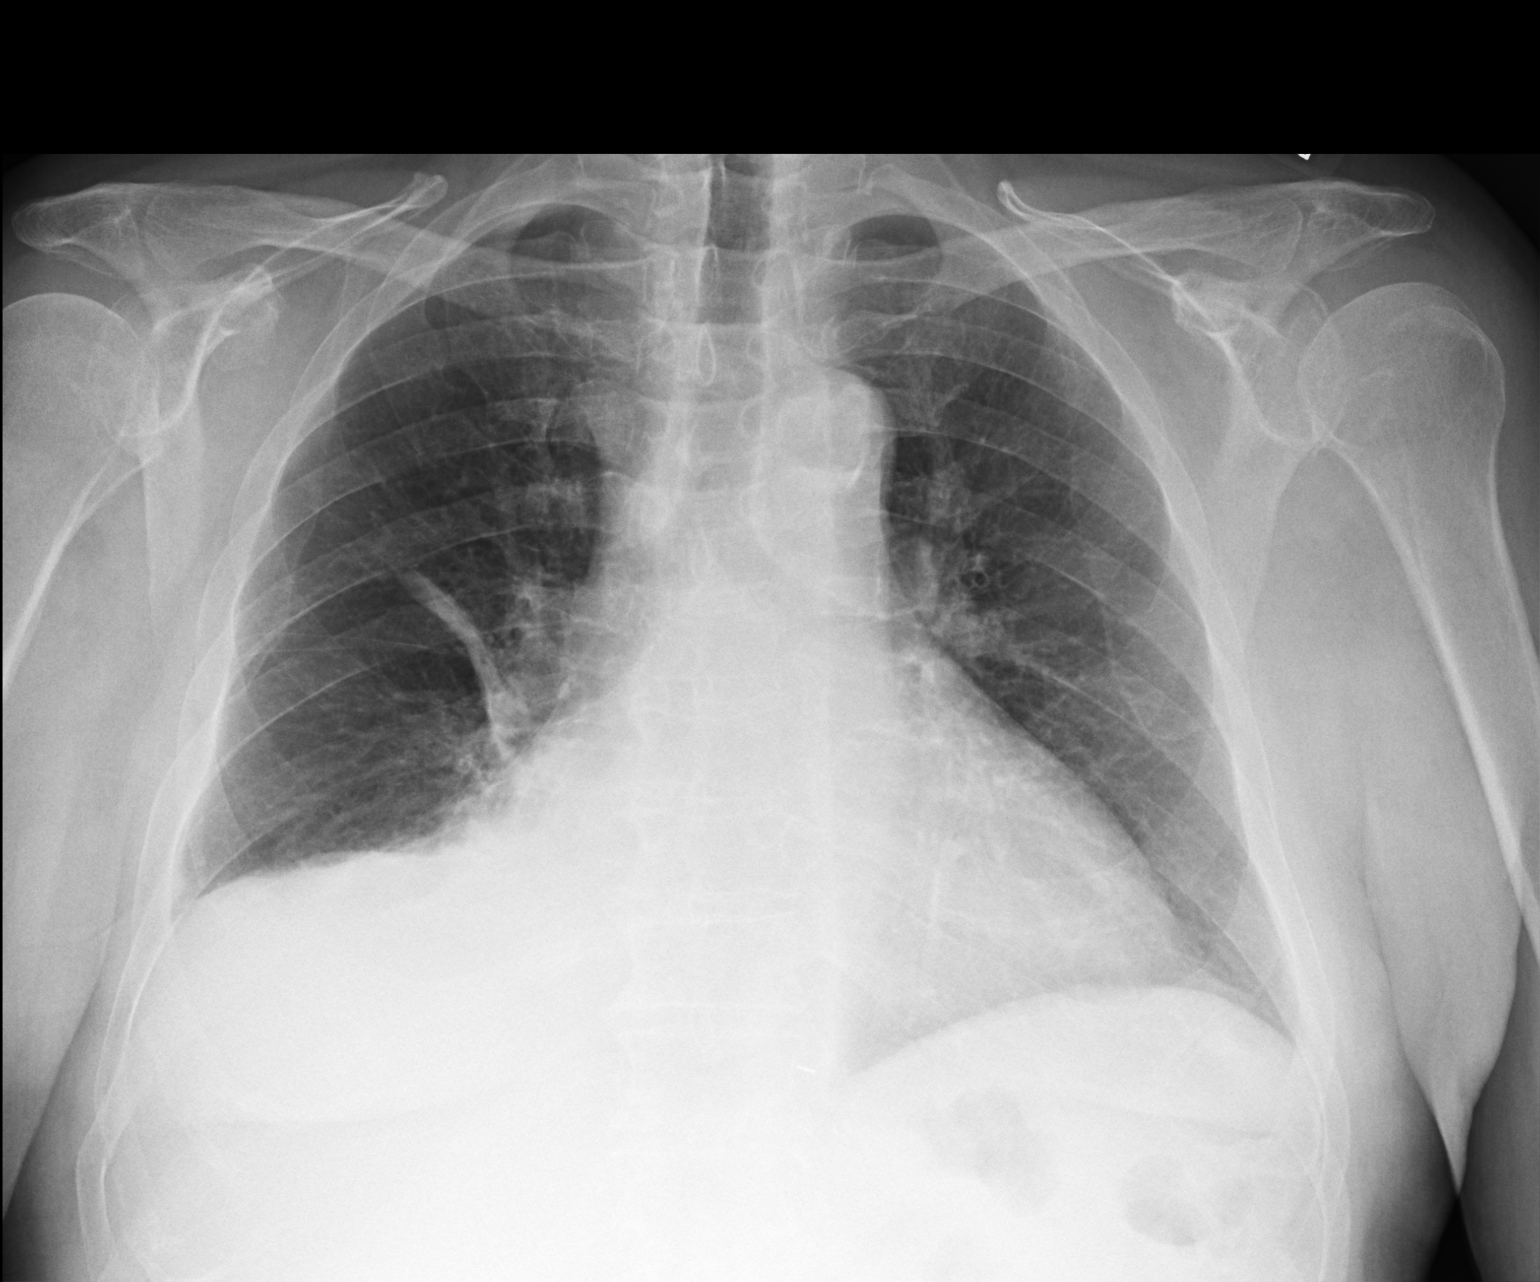
[im 2/2]
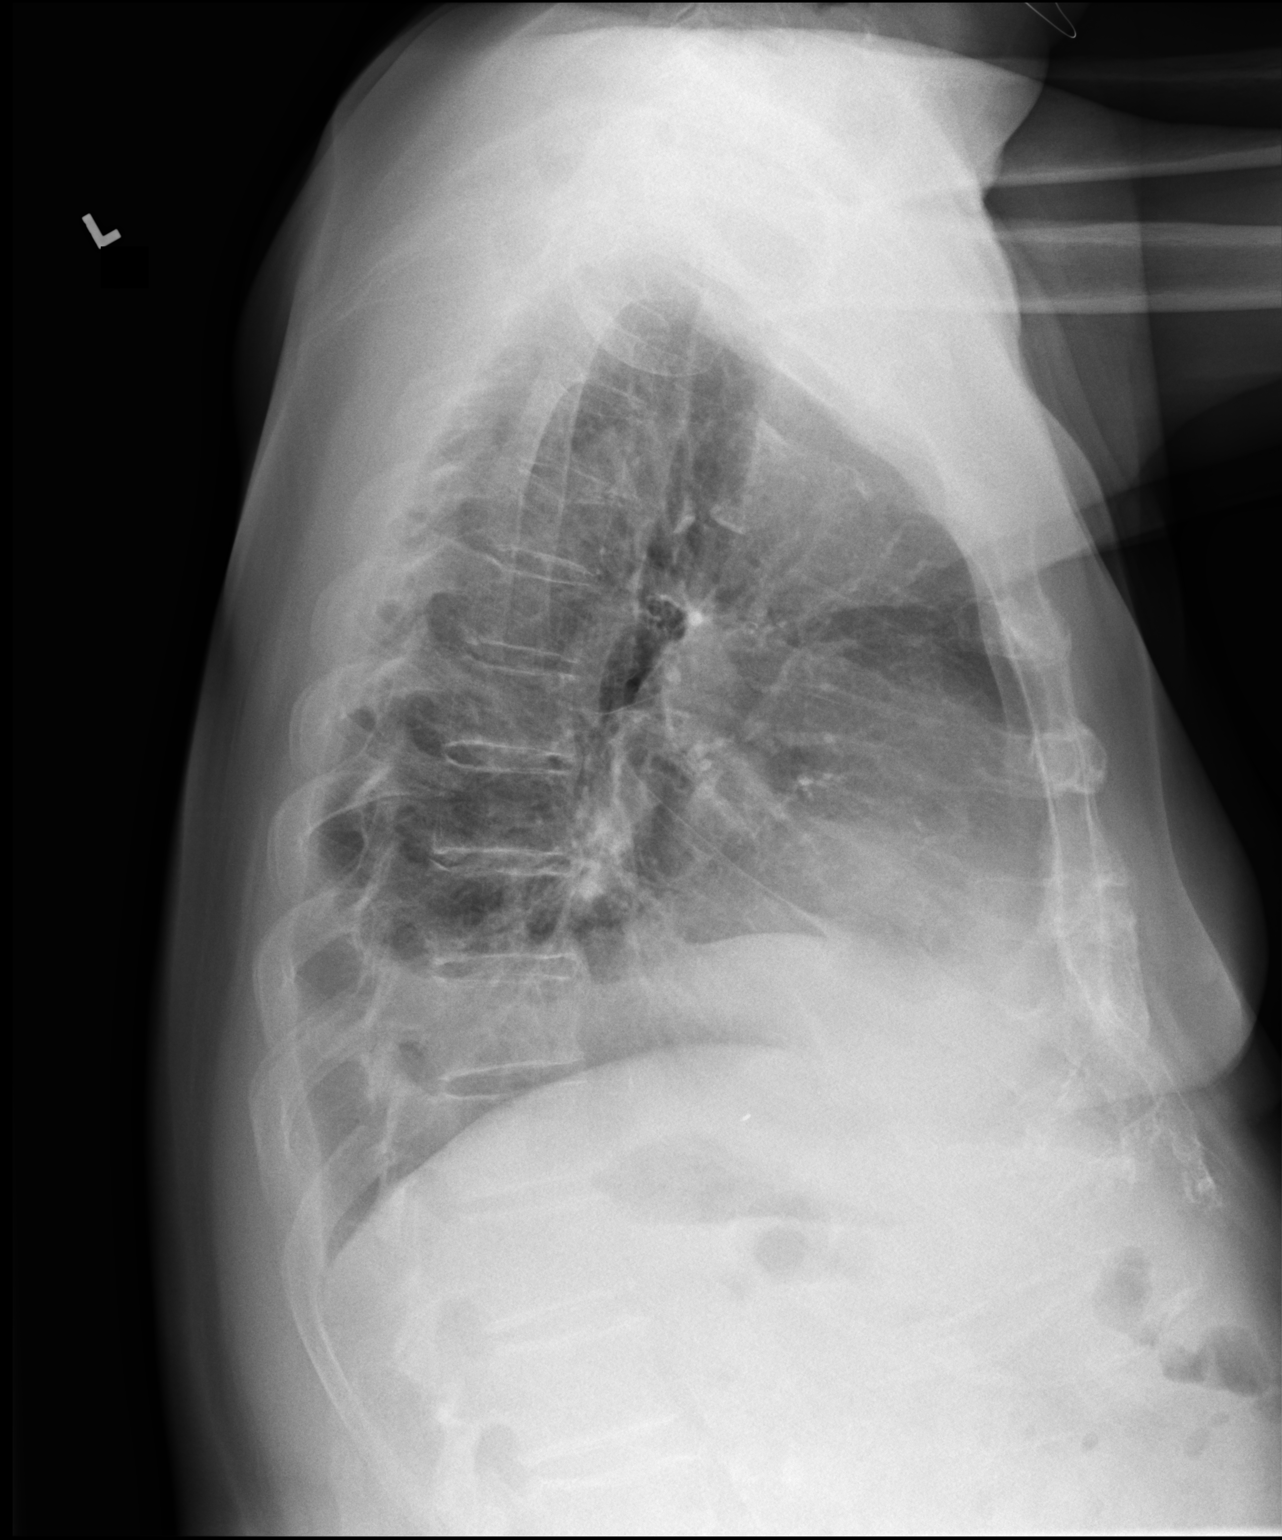

[2 of 2 positions shown; findings below may reference images not displayed]

FINDINGS: Mild elevation right hemidiaphragm. Atelectasis right midlung. Left 
lung is clear. No effusion. No pneumothorax. No acute osseous abnormality.
IMPRESSION: Atelectasis right midlung with mild right hemidiaphragm elevation.

## 2020-04-26 IMAGING — CT CT CHEST WITH CONTRAST
2 of 3 series · 15 of 36 positions shown, 18 images · IV contrast (isovue)
Comparison: Comparison was made to the prior exam(s) within the last 12 months 
dated  April 05, 2020 chest x-ray.

CT CHEST WITH CONTRAST, 04/26/2020 [DATE]: 
CLINICAL INDICATION: Atelectasis. Follow-up chest x-ray April 05, 2020 
A search for DICOM formatted images was conducted for prior CT imaging studies 
completed at a non-affiliated media free facility.
TECHNIQUE: The chest was scanned from base of neck through the lung bases with 
377cc of Isovue 300 injected intravenously on a high resolution low dose CT 
scanner.  Routine MPR and MIP 3D renderings were reconstructed on an independent 
workstation with concurrent physician supervision.

[Series 4: chest 2.0 i31s 3 · axial · 0.87mm/px · z∈[-56,+230]mm · 12 of 169 slices shown, 15 images]
[im 13/169  mediastinal]
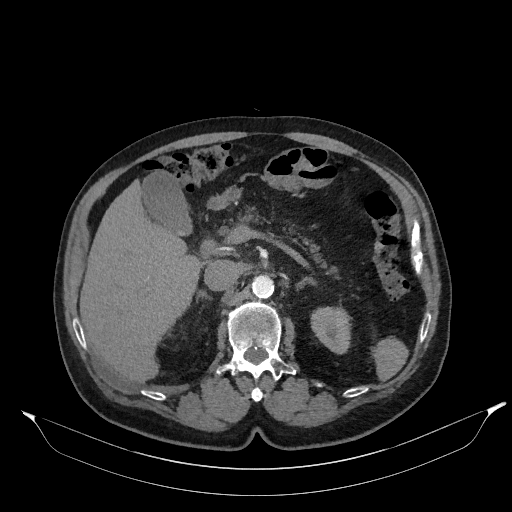
[im 13/169  lung]
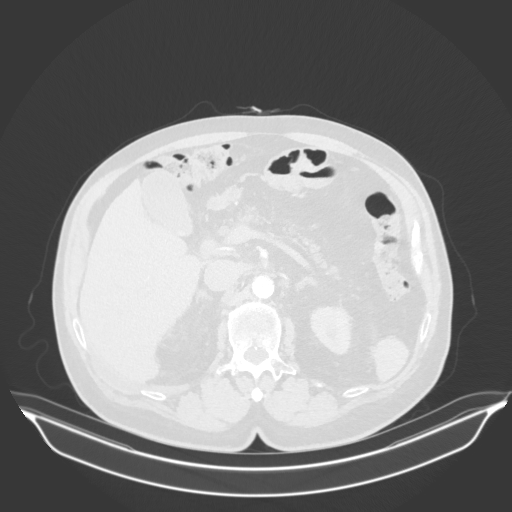
[im 25/169  lung]
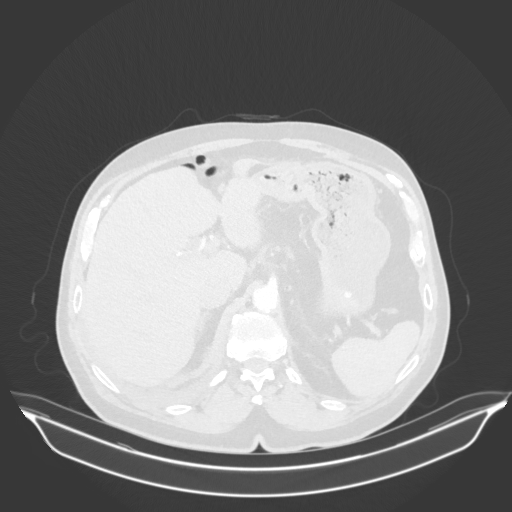
[im 38/169  lung]
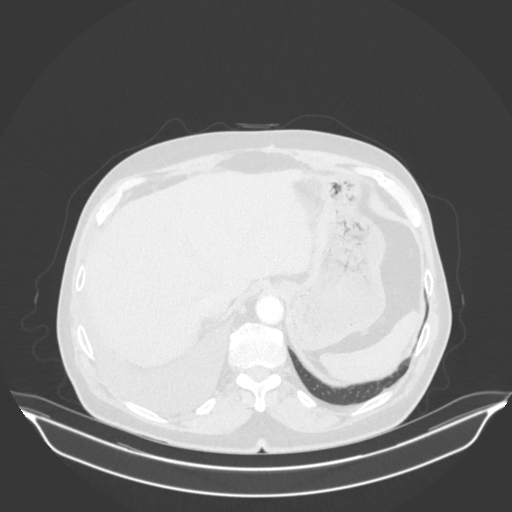
[im 50/169  lung]
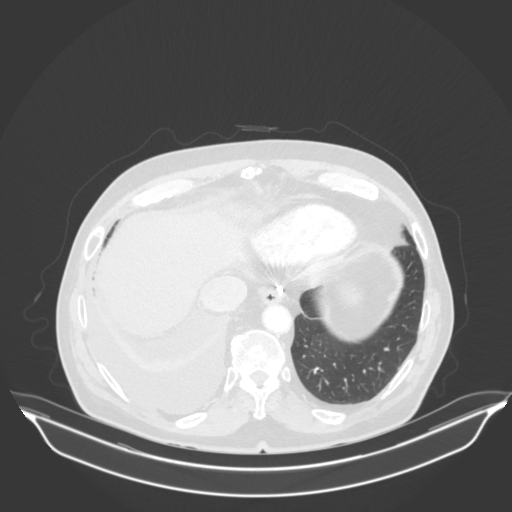
[im 63/169  mediastinal]
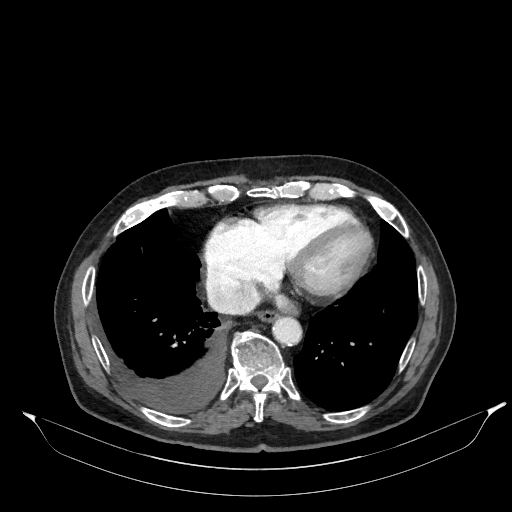
[im 63/169  lung]
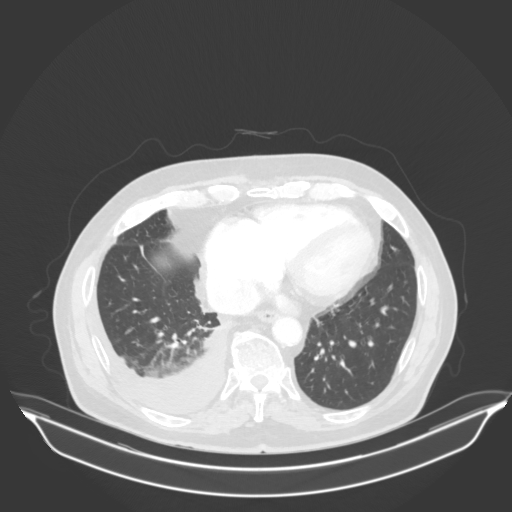
[im 75/169  lung]
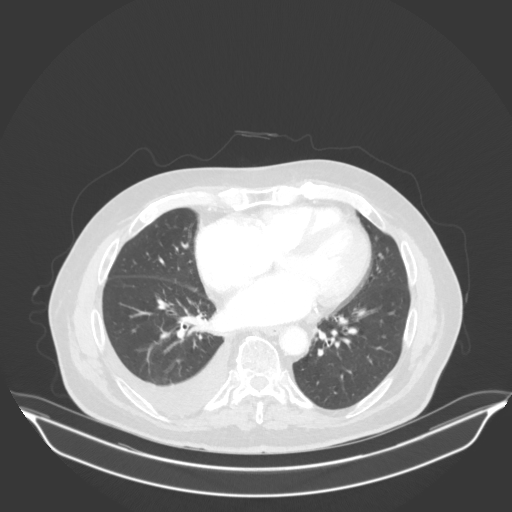
[im 94/169  lung]
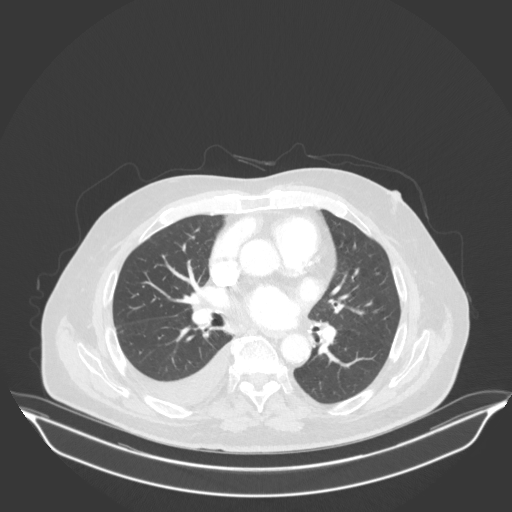
[im 106/169  lung]
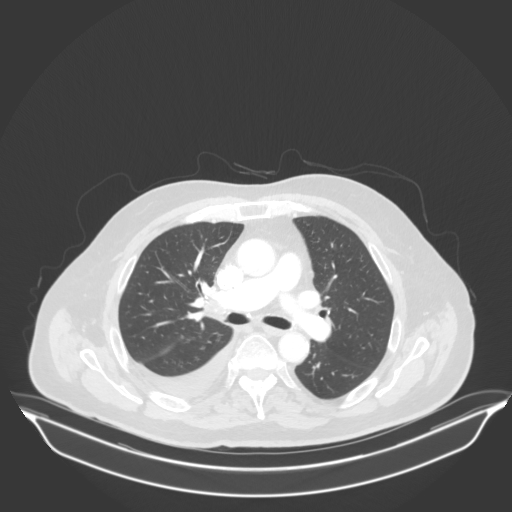
[im 119/169  mediastinal]
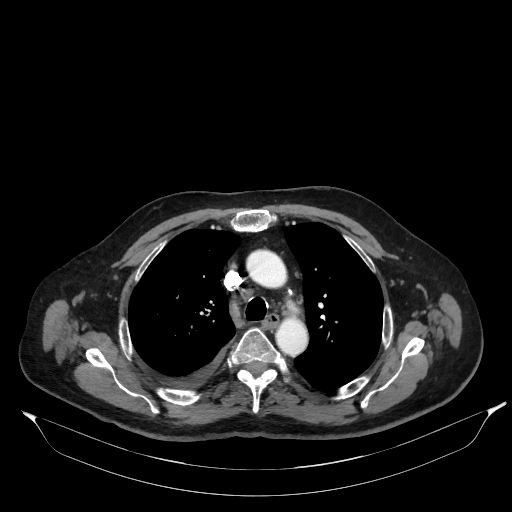
[im 119/169  lung]
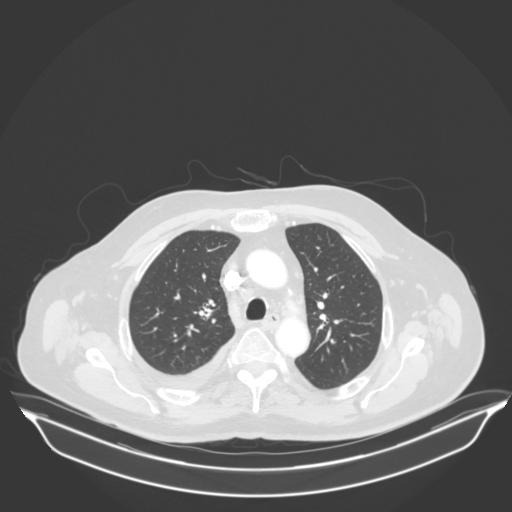
[im 131/169  lung]
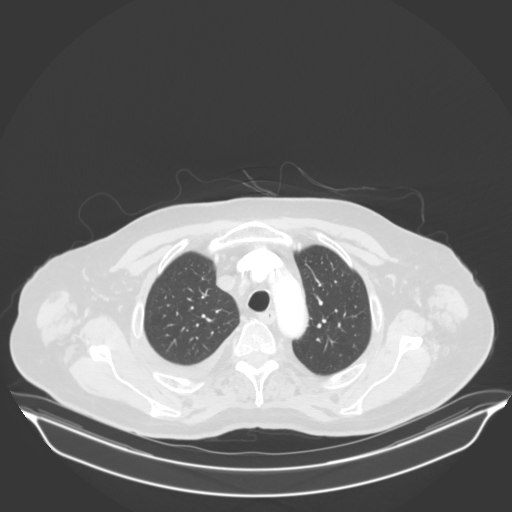
[im 144/169  lung]
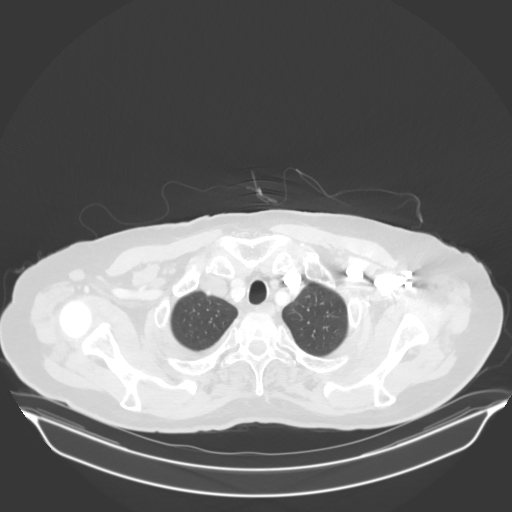
[im 156/169  lung]
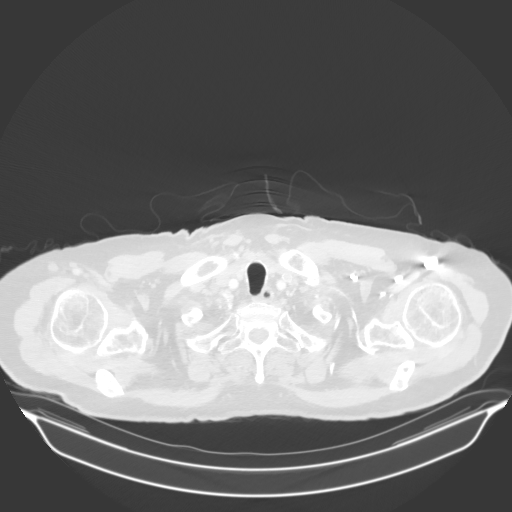

[Series 7: coronal · coronal · 0.66mm/px · 3 of 141 slices shown]
[im 29/141  lung]
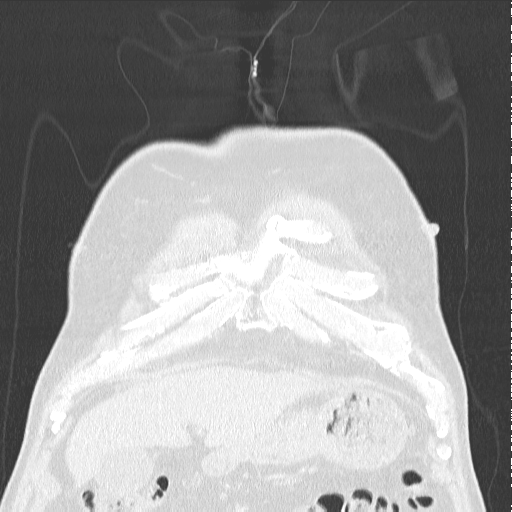
[im 57/141  lung]
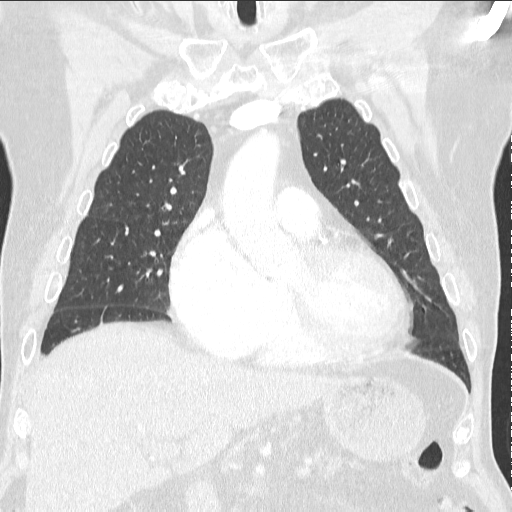
[im 85/141  lung]
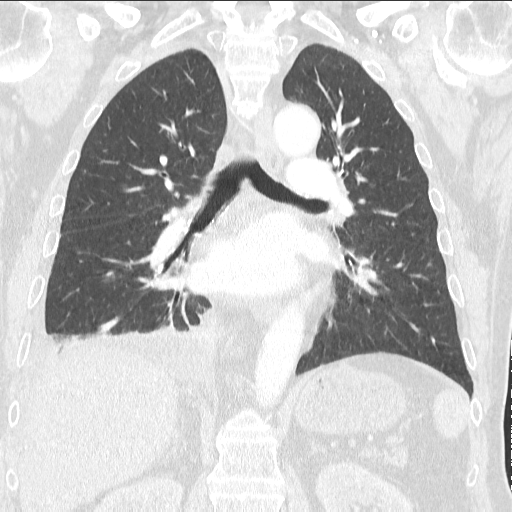

[15 of 36 positions shown; findings below may reference images not displayed]

FINDINGS: Moderate RIGHT pleural effusion, 4 cm in thickness at the dependent 
RIGHT lung base with compressive atelectasis RIGHT lower lobe. Remaining lungs 
are well-expanded and clear. Cardiomegaly. Transverse diameter of the heart is 
15.4 cm. Extensive coronary calcifications. No pulmonary emboli. 
Small gallstones. 
Liver, spleen, adrenals and pancreas are normal. Calcified aorta is not dilated. 
Upper kidneys are normal in size and function. 
Calcified aorta is not dilated. Celiac widely patent. Extensive calcified and 
soft plaque in the proximal SMA with at least 70 % narrowing. 
Mild degenerative change in the spine.
IMPRESSION: Moderate RIGHT pleural effusion with compressive atelectasis RIGHT lower lobe. 
Pleural effusion may be due to mild pulmonary vascular congestion. Patient does 
not appear to be in congestive heart failure. Cardiomegaly. Extensive coronary 
calcifications. 
70% narrowing SMA. Patient may be a candidate for angioplasty by interventional 
radiology. 
Small gallstones. 
Radiation DOSE REDUCTION: All CT scans are performed using radiation dose 
reduction techniques, when applicable.  Technical factors are evaluated and 
adjusted to ensure appropriate moderation of exposure.  Automated dose 
management technology is applied to adjust the radiation doses to minimize 
exposure while achieving diagnostic quality images.

## 2020-07-11 IMAGING — DX CHEST PA AND LATERAL
1 series · 2 of 2 positions shown · non-contrast
Comparison: none

CLINICAL INDICATION:  Post therapeutic RIGHT thoracentesis 
COMPARISON EXAMINATION: CT chest 04/26/2020, chest x-ray 04/05/2020
TECHNIQUE: 2 views of the chest

[Series 1: PA · U · 0.14mm/px · 2 of 2 slices shown]
[im 1/2]
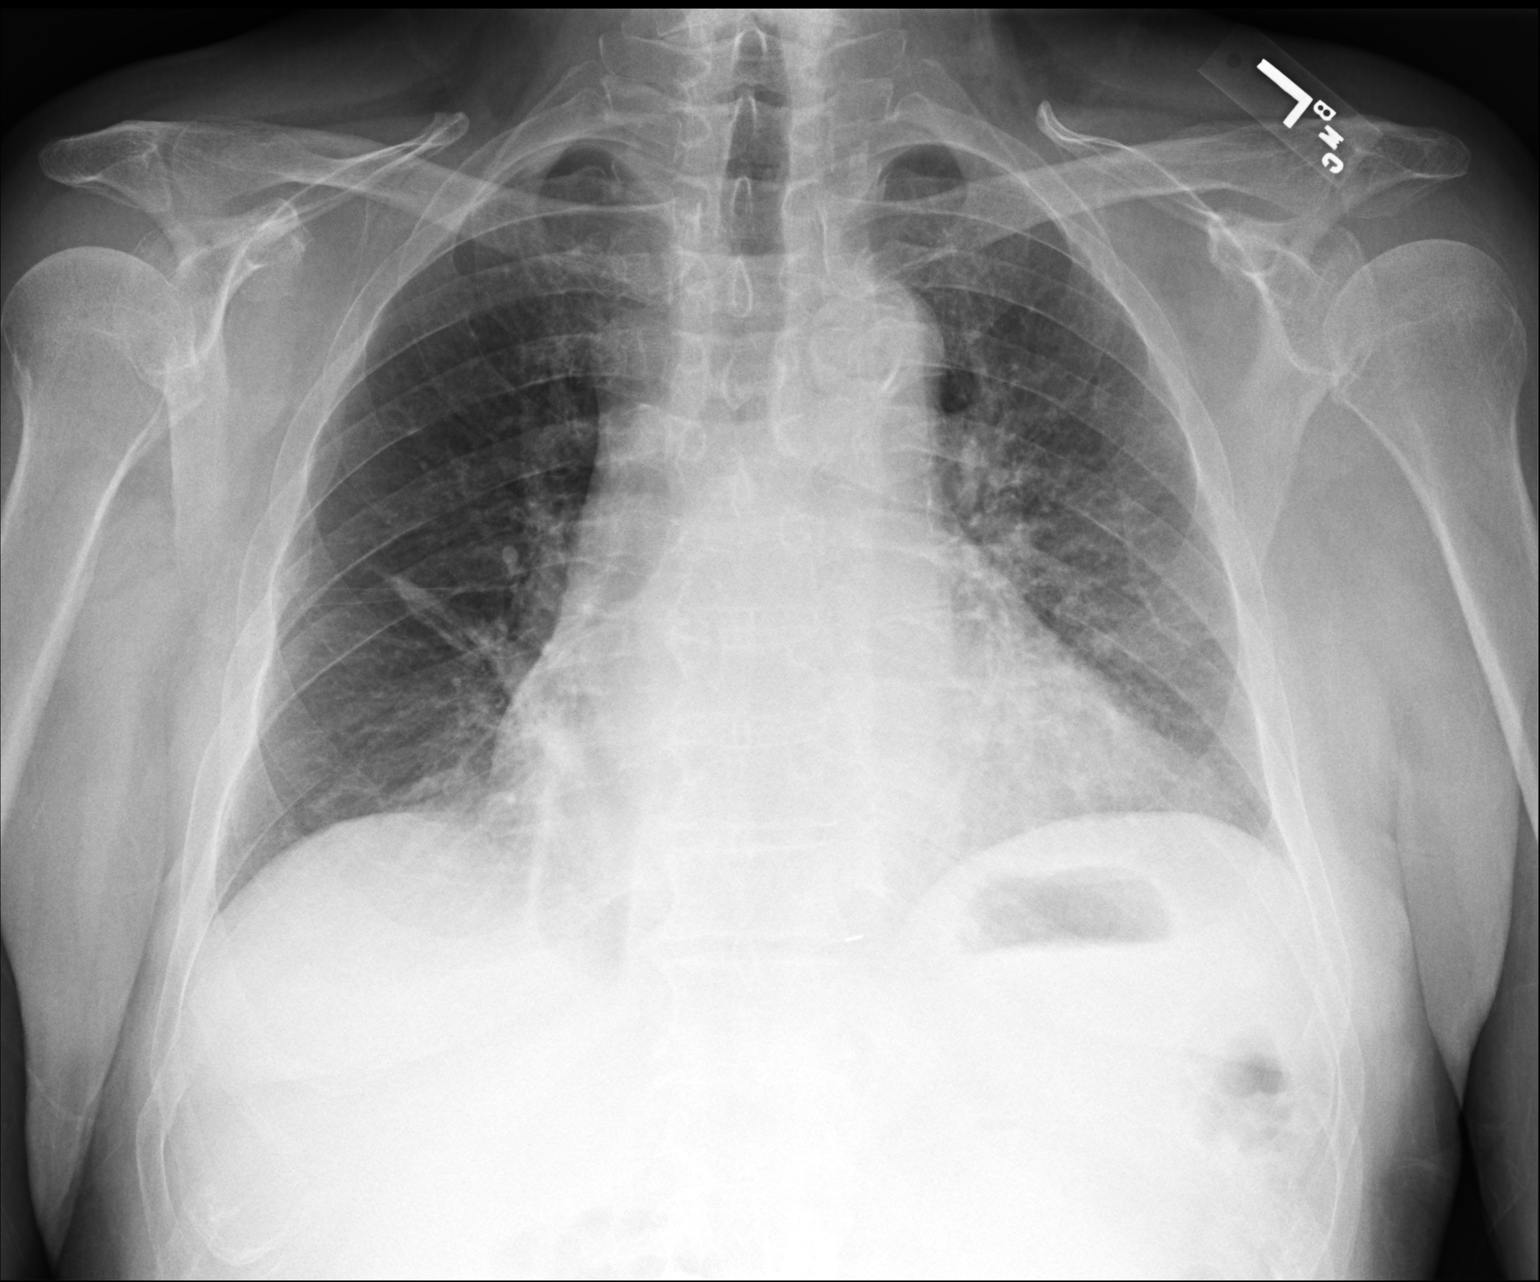
[im 2/2]
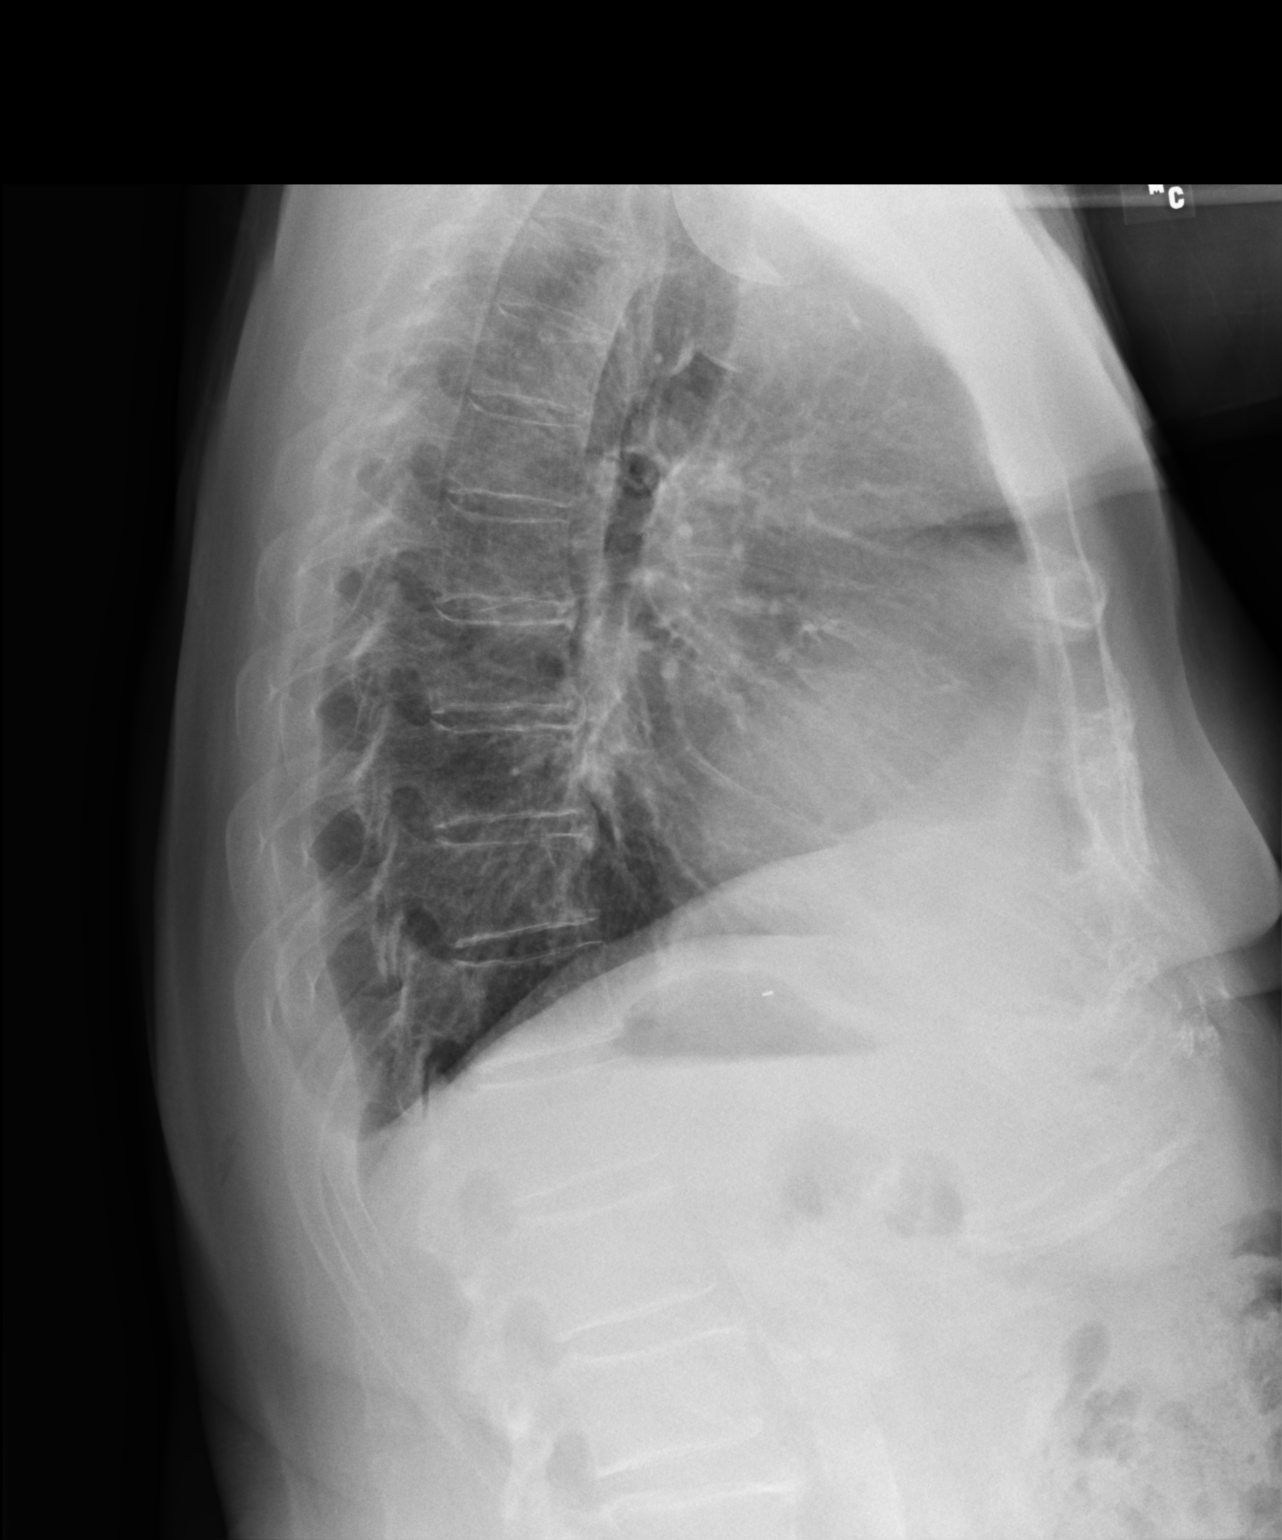

[2 of 2 positions shown; findings below may reference images not displayed]

FINDINGS: Persistent enlarged cardiac silhouette. Previous no significant 
pericardial effusion was present. Right midlung platelike atelectasis. Minimal 
residual fluid in the posterior costophrenic sulcus. No pneumothorax. Mild 
congestion of the LEFT lung interstitium. Degenerative spondylosis.
IMPRESSION: No pneumothorax status post post RIGHT thoracentesis with removal of 5322 mL. 
Mild nodular congestion of the LEFT lung interstitium. 
Persistent cardiomegaly. No LEFT pleural effusion.

## 2020-08-10 IMAGING — DX CHEST PA AND LATERAL
1 series · 2 of 2 positions shown · non-contrast
Comparison: 07/11/2020

CLINICAL INDICATION: Shortness of breath for 2 months, checking for fluid

[Series 1: PA · U · 0.14mm/px · 2 of 2 slices shown]
[im 1/2]
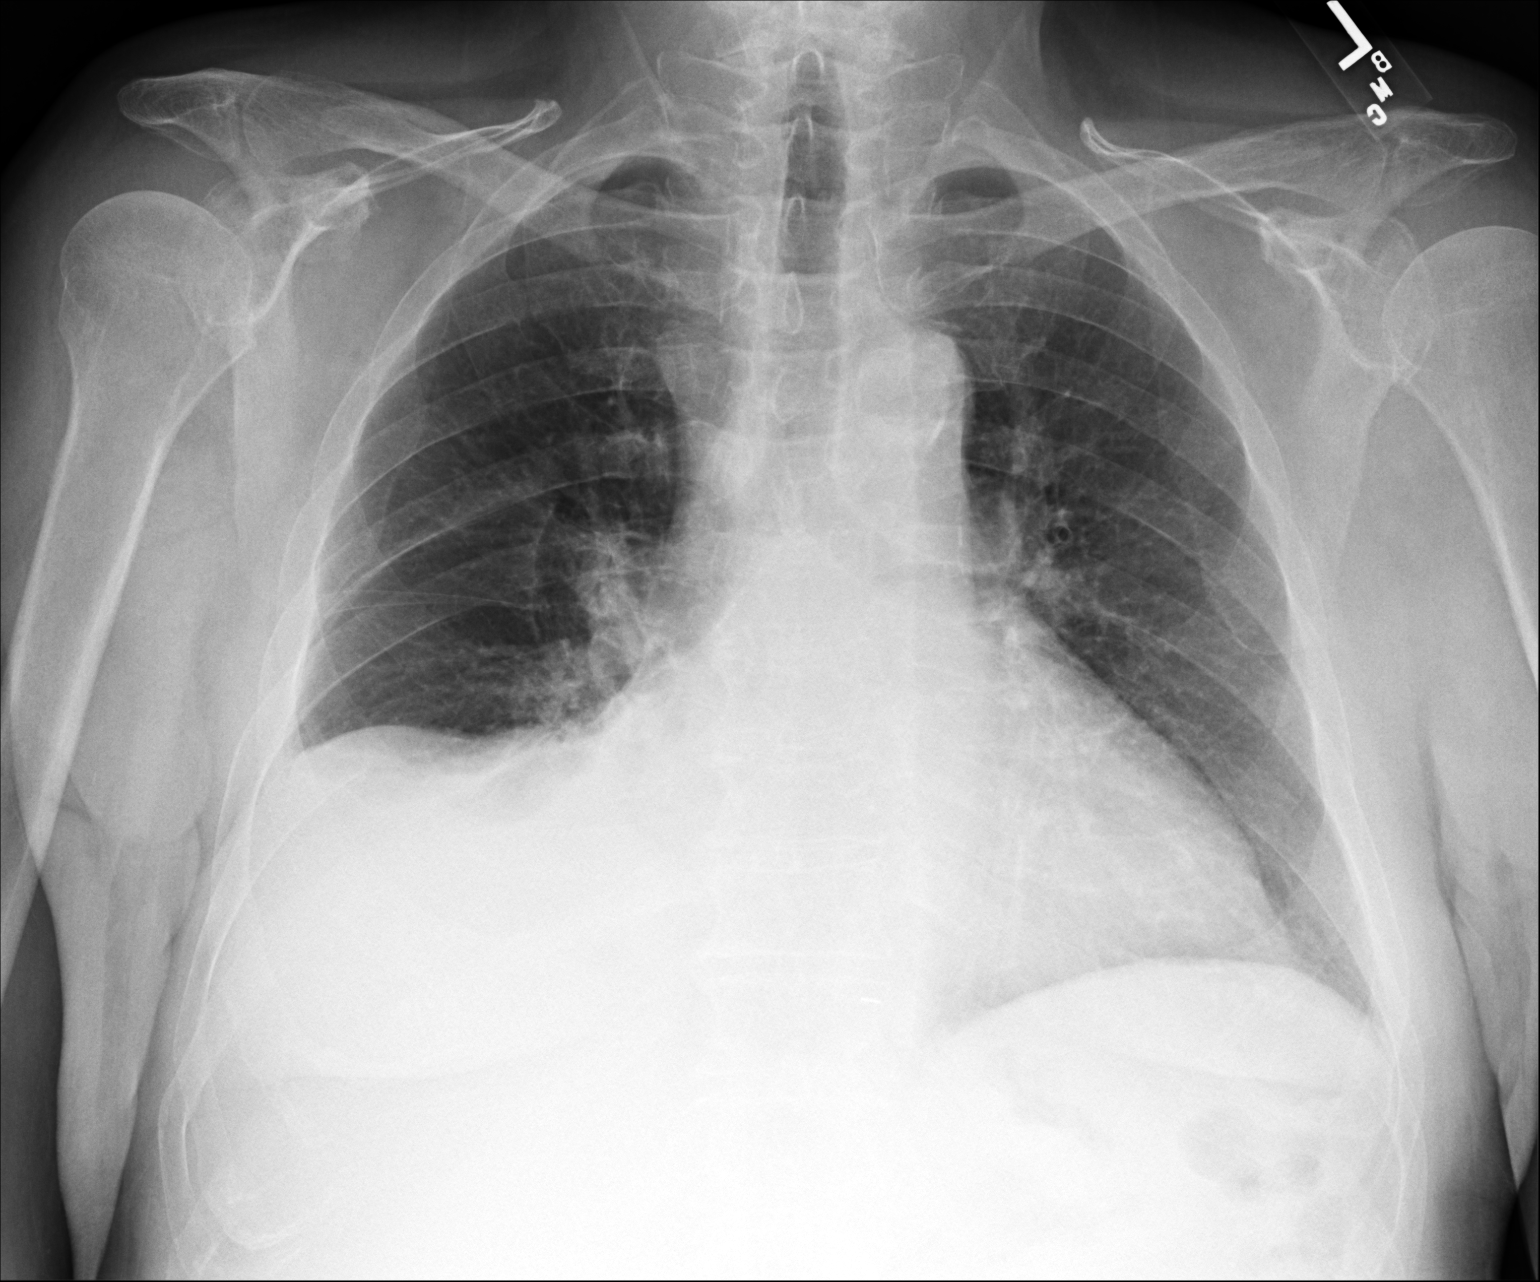
[im 2/2]
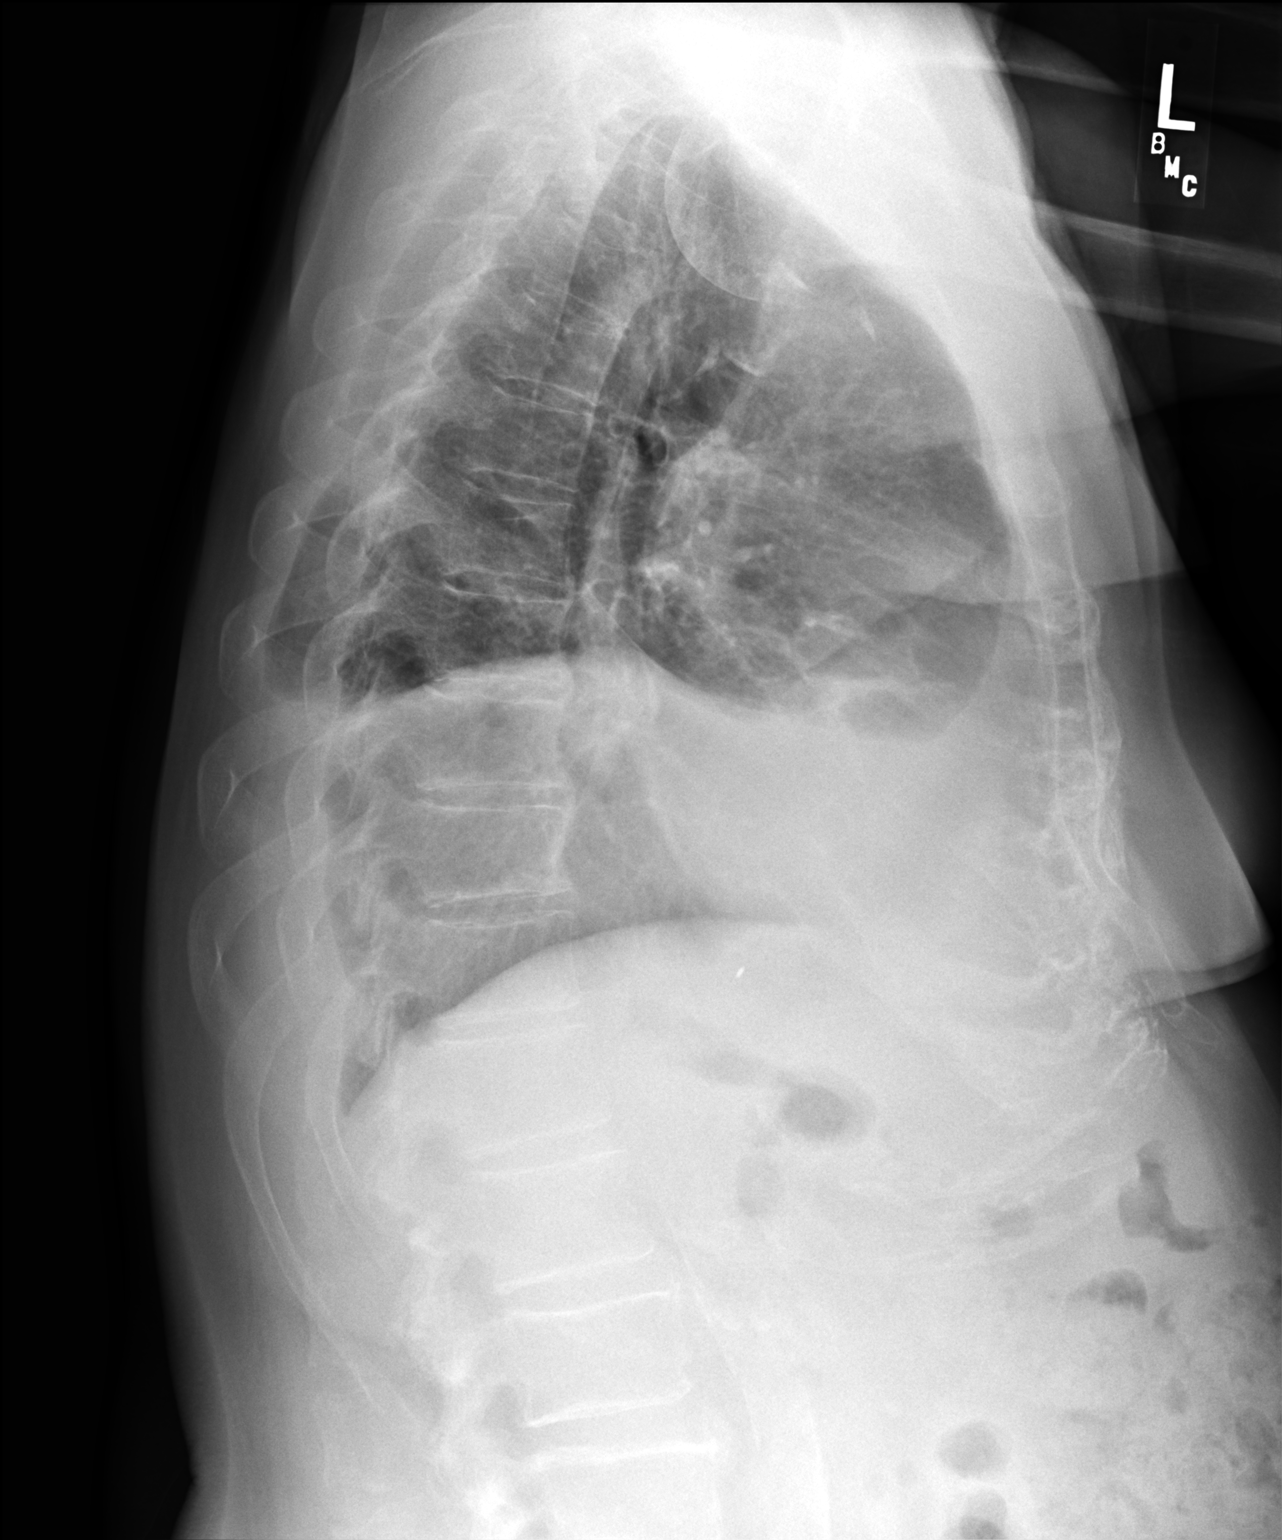

[2 of 2 positions shown; findings below may reference images not displayed]

FINDINGS: Medical devices: None 
Cardiac silhouette appears enlarged. Mediastinal contours within normal limits. 
There is mild pulmonary interstitial edema. There is a small right-sided 
effusion with associated inferior right lung atelectasis. No focal consolidation 
or pneumothorax. 
Osseous structures demonstrate no acute abnormality or aggressive appearing 
osseous lesion.
IMPRESSION: Small right effusion with mild interstitial edema in the setting of cardiomegaly 
likely reflecting CHF.

## 2020-09-23 IMAGING — DX CHEST PA AND LATERAL
1 series · 2 of 2 positions shown · non-contrast
Comparison: none

CLINICAL INDICATION:  Follow-up RIGHT pleural effusion, thoracentesis [DATE], 
history of atrial fibrillation 
COMPARISON EXAMINATION: Chest x-ray 08/10/2020, 07/11/2020, CT chest 04/26/2020, 
chest x-ray 04/05/2020
TECHNIQUE: 2 views of the chest

[Series 1: PA · U · 0.14mm/px · 2 of 2 slices shown]
[im 1/2]
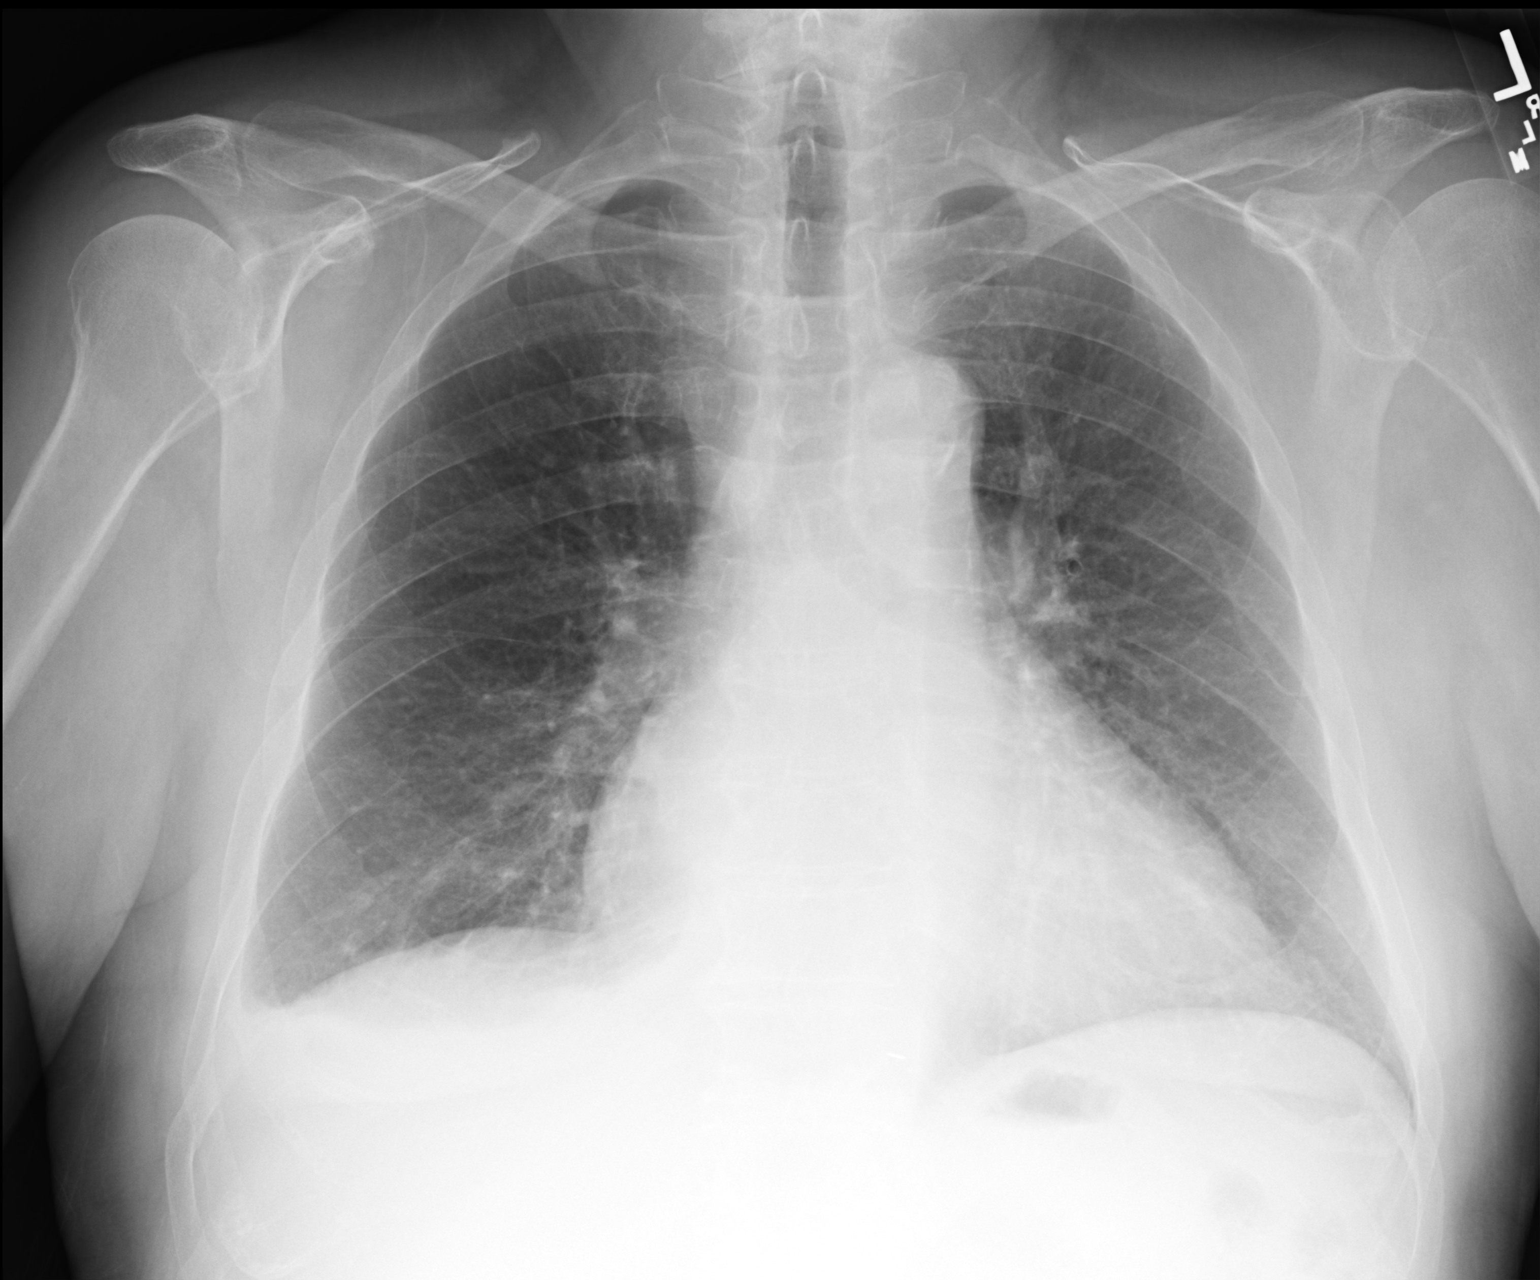
[im 2/2]
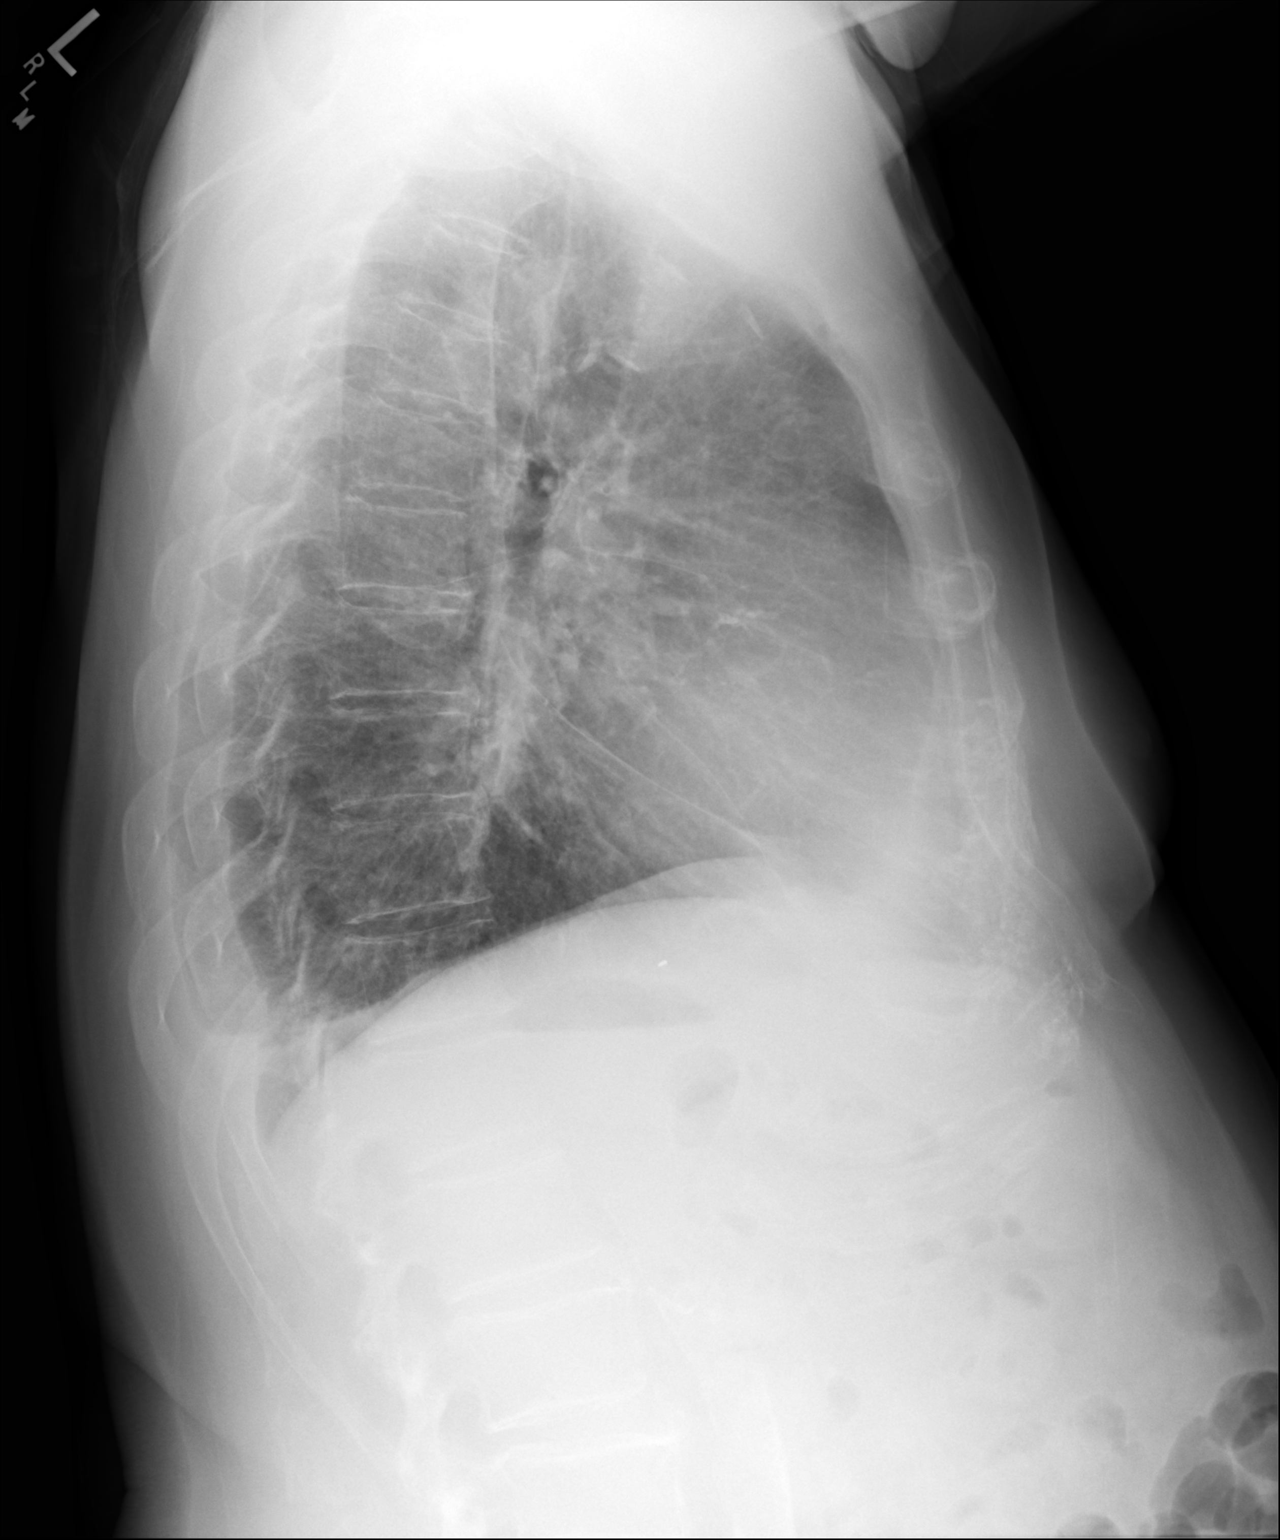

[2 of 2 positions shown; findings below may reference images not displayed]

FINDINGS: Trace RIGHT pleural effusion. This subpulmonic effusion seen 
previously is not currently identified. Enlarged cardiac silhouette similar to 
previous. No significant cephalization. Bilateral bronchial wall thickening in 
the lower lobes. Degenerative thoracic spondylosis.
IMPRESSION: Trace RIGHT pleural effusion. The previously identified subpulmonic effusion is 
not present. 
No pneumothorax. 
Mild bronchial wall thickening lower lobes similar to previous. 
Stable cardiomegaly. No pericardial effusion seen on previous CT in April 2020.

## 2021-06-27 IMAGING — MR MRI RIGHT SHOULDER WITHOUT CONTRAST
4 of 6 series · 18 of 40 positions shown · IV contrast (gadolinium)
Comparison: None

FINAL Diagnostic Imaging Report 
________________________________________________________________________________________________ 
MRI RIGHT SHOULDER WITHOUT CONTRAST, 06/27/2021 [DATE]: 
CLINICAL INDICATION: Pain in right shoulder for 3 weeks.
TECHNIQUE: Multiplanar, multiecho position MR images of the shoulder were 
performed without intravenous gadolinium enhancement. Patient was scanned on a 
1.5T magnet.

[Series 3: PD fat-sat · axial · 3.0mm · 0.33mm/px · z∈[-2,+88]mm · 8 of 27 slices shown]
[im 1/27]
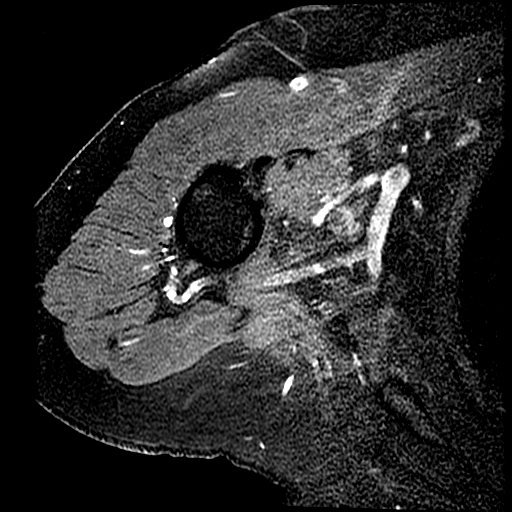
[im 4/27]
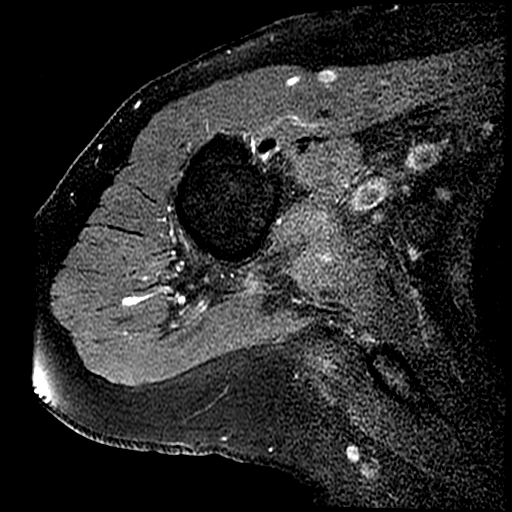
[im 8/27]
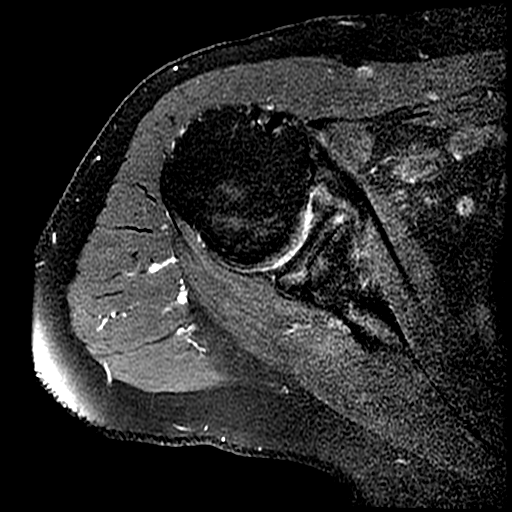
[im 12/27]
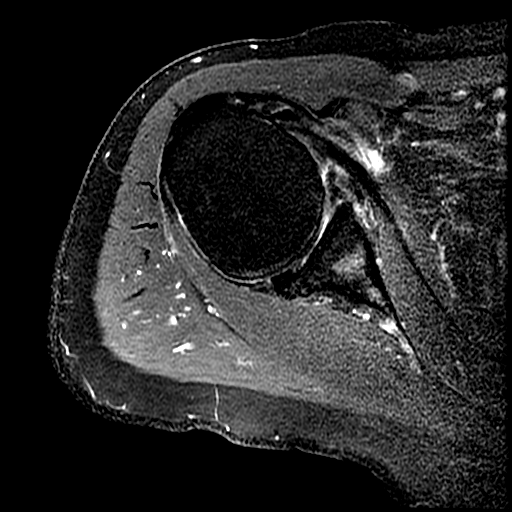
[im 15/27]
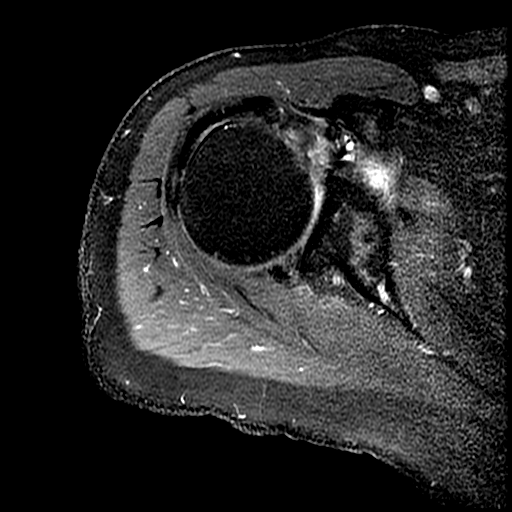
[im 19/27]
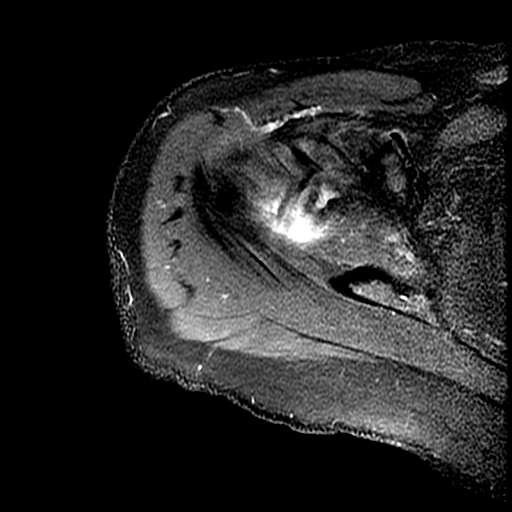
[im 23/27]
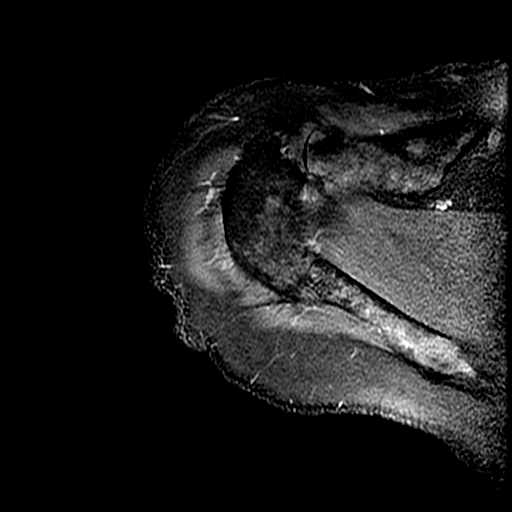
[im 27/27]
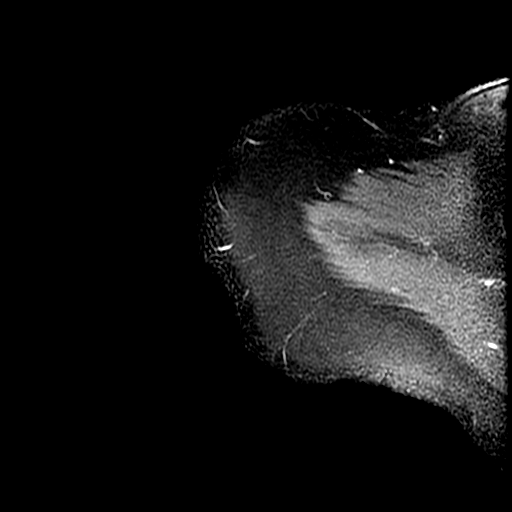

[Series 4: T2 fat-sat · oblique · 4.0mm · 0.33mm/px · 4 of 24 slices shown (1 of 2)]
[im 1/24]
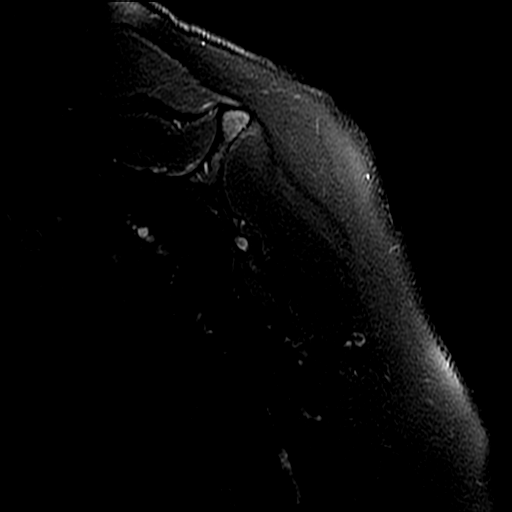
[im 4/24]
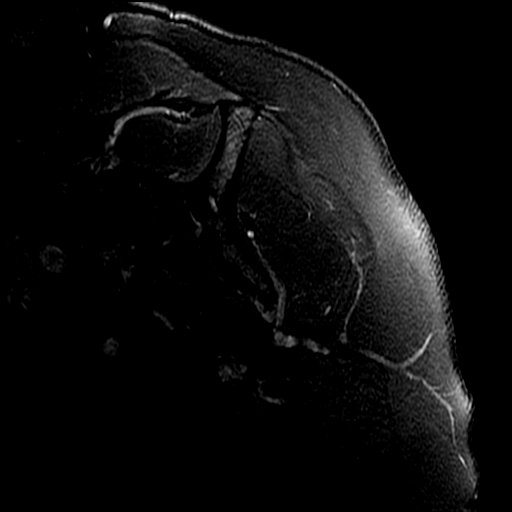
[im 12/24]
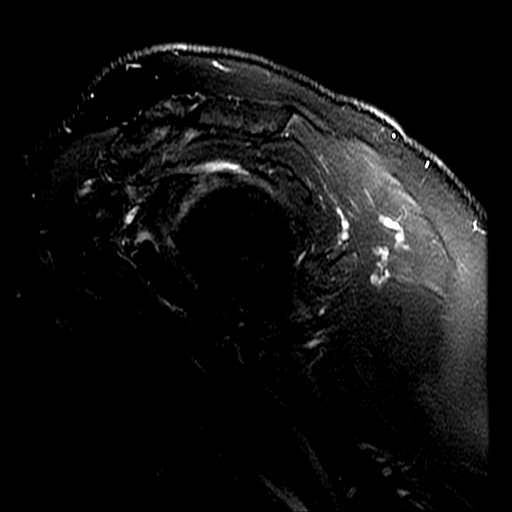
[im 20/24]
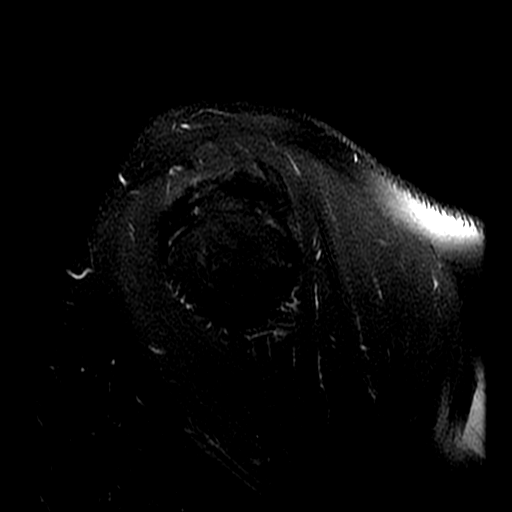

[Series 5: T1 · oblique · 4.0mm · 0.33mm/px · 3 of 24 slices shown]
[im 4/24]
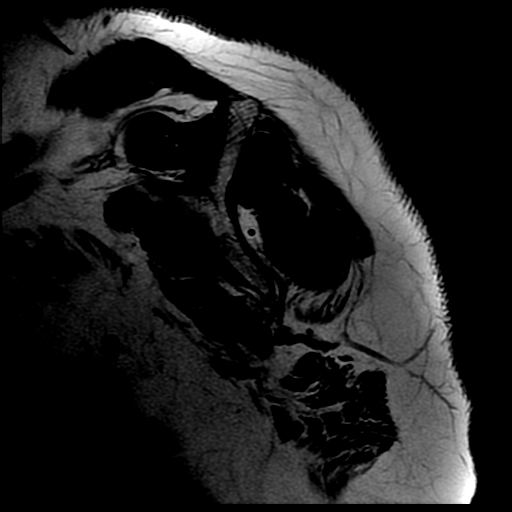
[im 12/24]
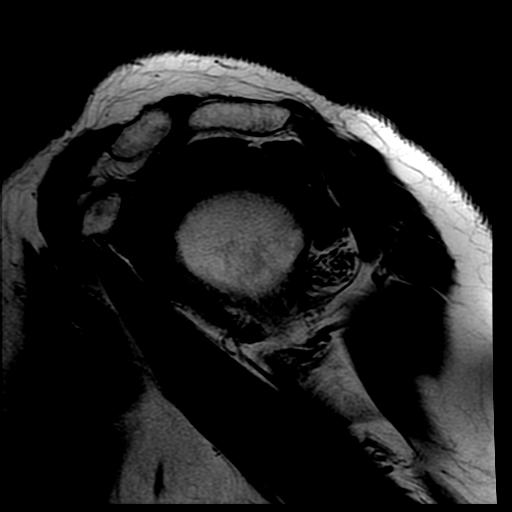
[im 20/24]
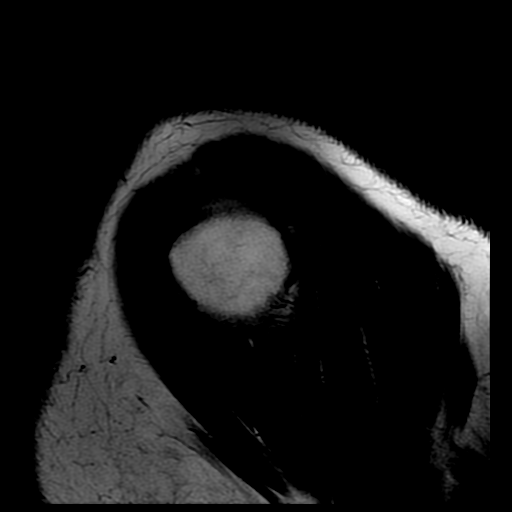

[Series 7: T2 fat-sat · oblique · 3.0mm · 0.33mm/px · 3 of 22 slices shown (2 of 2)]
[im 4/22]
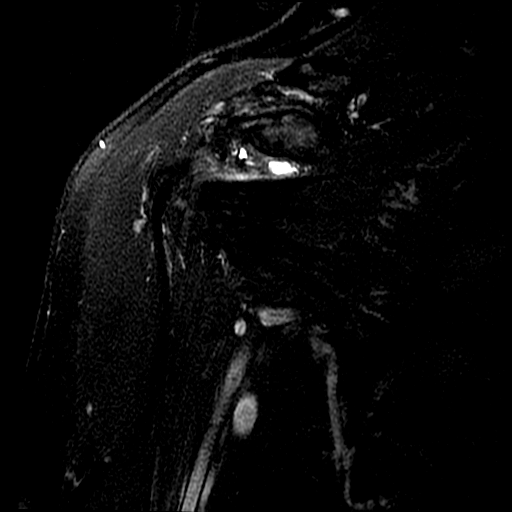
[im 11/22]
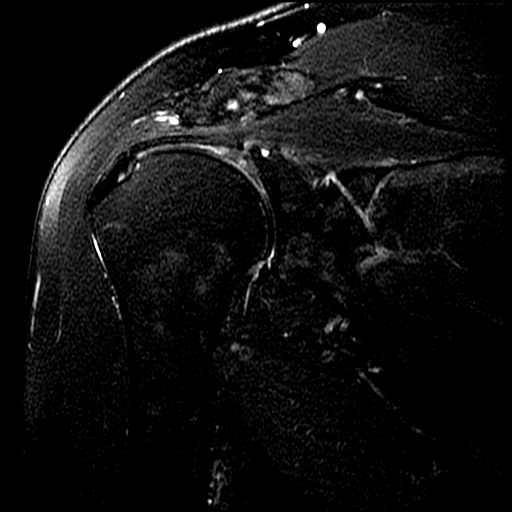
[im 18/22]
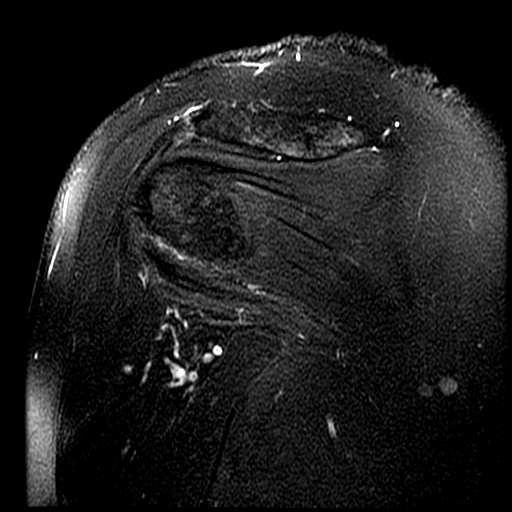

[18 of 40 positions shown; findings below may reference images not displayed]

FINDINGS: ROTATOR CUFF: Small low-grade partial-thickness articular sided distal 
supraspinatus tendon tear measures 1 cm in AP dimensions. Small low-grade 
partial-thickness articular sided distal subscapularis tendon tear measures
cm in height. The infraspinatus and teres minor tendons are intact. Marked teres 
minor fatty muscular atrophy. 
ACROMIOCLAVICULAR JOINT: Mild degenerative change of the acromioclavicular joint 
with mild mass effect upon the supraspinatus. The coracoacromial ligament is 
intact without prominent spurring at the acromial attachment. The 
acromioclavicular and coracoclavicular ligaments are preserved. The acromium is 
normal in morphology. 
GLENOHUMERAL JOINT: The humeral head is well located within the glenoid fossa. 
Partial thickness chondromalacia.  Multifocal degenerative labral tears with 
largest largest paralabral cysts measuring 0.7 cm posterosuperiorly and 
posteroinferiorly. The intra-articular portion of the long head of the biceps 
tendon is negative. No shoulder joint effusion. 
BONES: The bone marrow signal intensity is negative for fracture. No Hill-Sachs 
defect. Subcortical cystic change of the humeral head. 
ADDITIONAL FINDINGS: The axillary region is negative. Subcutaneous tissues are 
negative.
IMPRESSION: 1.  Small low-grade partial-thickness articular sided distal supraspinatus 
tendon tear measures 1 cm in AP dimensions. Small low-grade partial-thickness 
articular sided distal subscapularis tendon tear measures 0.4 cm in height. The 
infraspinatus and teres minor tendons are intact. Marked teres minor fatty 
muscular atrophy. 
2.  Mild glenohumeral joint degenerative change and multifocal degenerative 
labral tears with 0.7 cm posterior paralabral cysts.  
3.  Mild AC joint degenerative change with mild mass effect upon the 
supraspinatus. 
4.  Marked teres minor fatty muscular atrophy.

## 2022-03-30 IMAGING — MG MAMMOGRAPHY DIAGNOSTIC BILATERAL 3[PERSON_NAME]
8 series · 9 of 24 positions shown · non-contrast
Comparison: None

________________________________________________________________________________________________ 
MAMMOGRAPHY DIAGNOSTIC BILATERAL [facility], RIGHT BREAST ULTRASOUND UNILATERAL 
LIMITED, 03/30/2022 [DATE]: 
CLINICAL INDICATION: Tenderness right breast
TECHNIQUE: Digital bilateral mammograms and 3-D Tomosynthesis were obtained. 
These were interpreted both primarily and with the aid of computer-aided 
detection system. Sonography was performed of the right breast as well. 
BREAST DENSITY: (Level B) There are scattered areas of fibroglandular density.

[L MLO]
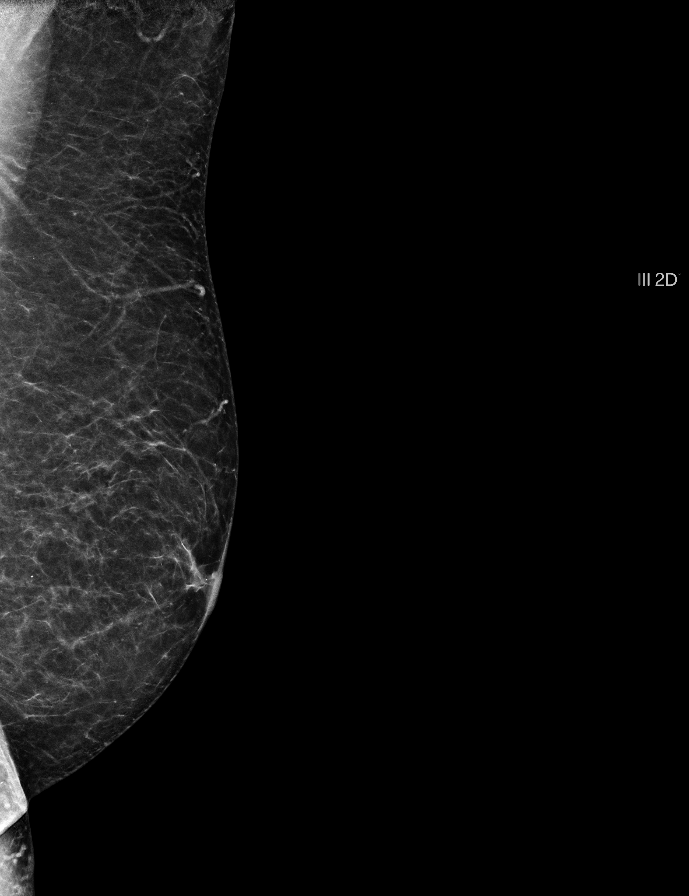

[R CC]
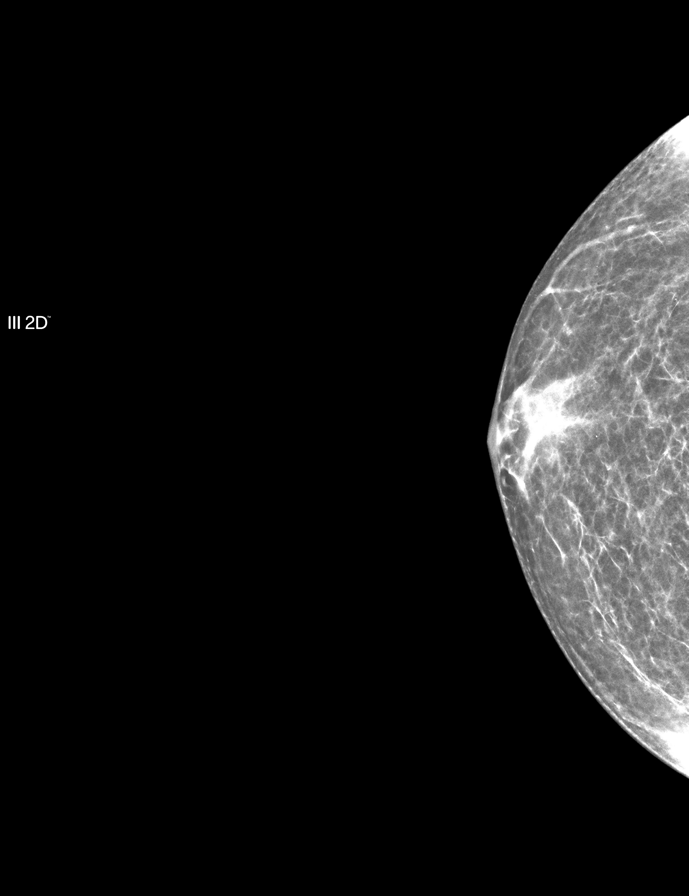

[L CC]
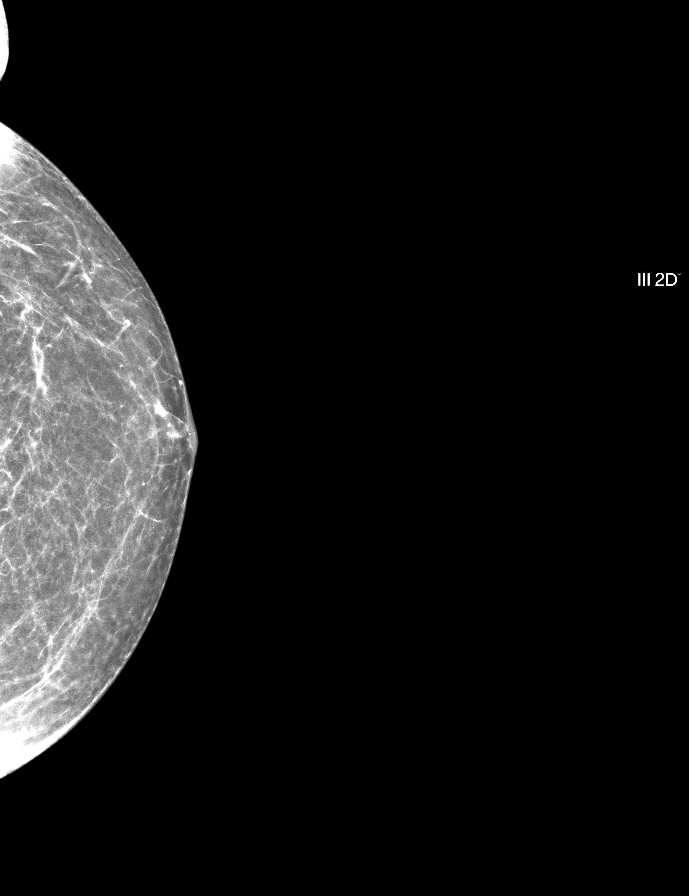

[R MLO]
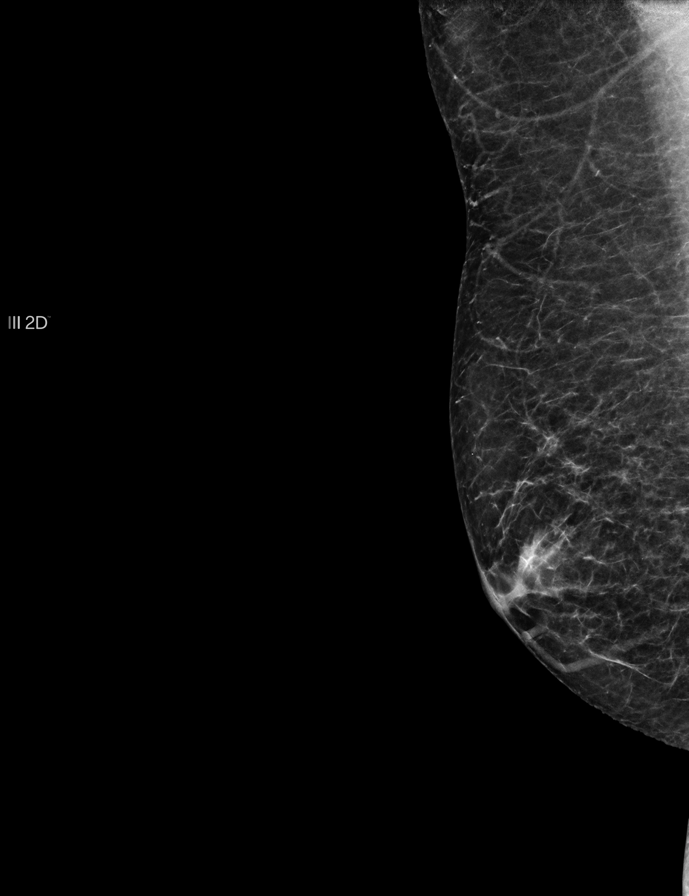

[L CC tomo · 2 of 46 frames shown]
[frame 15/46]
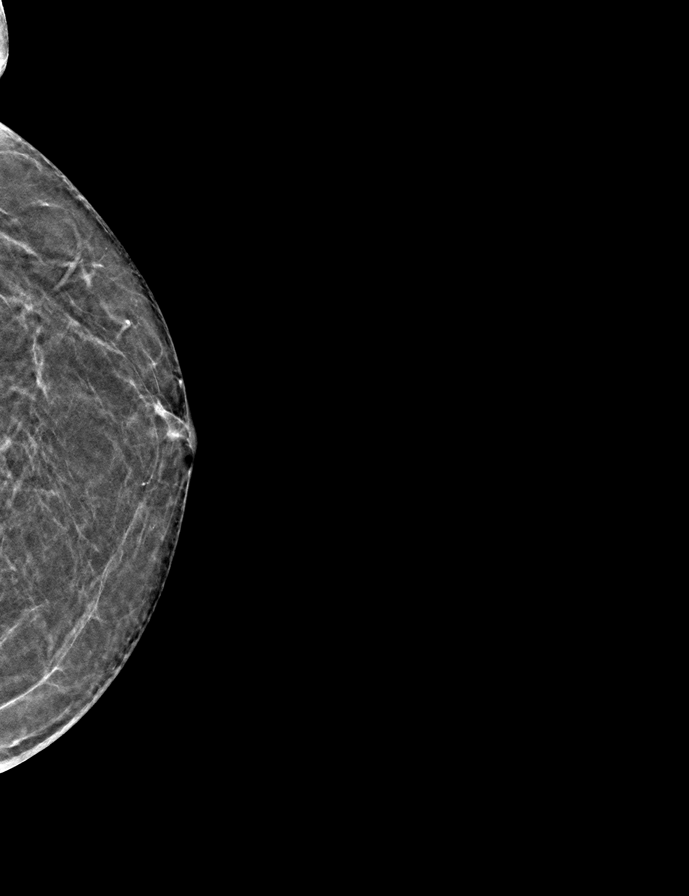
[frame 23/46]
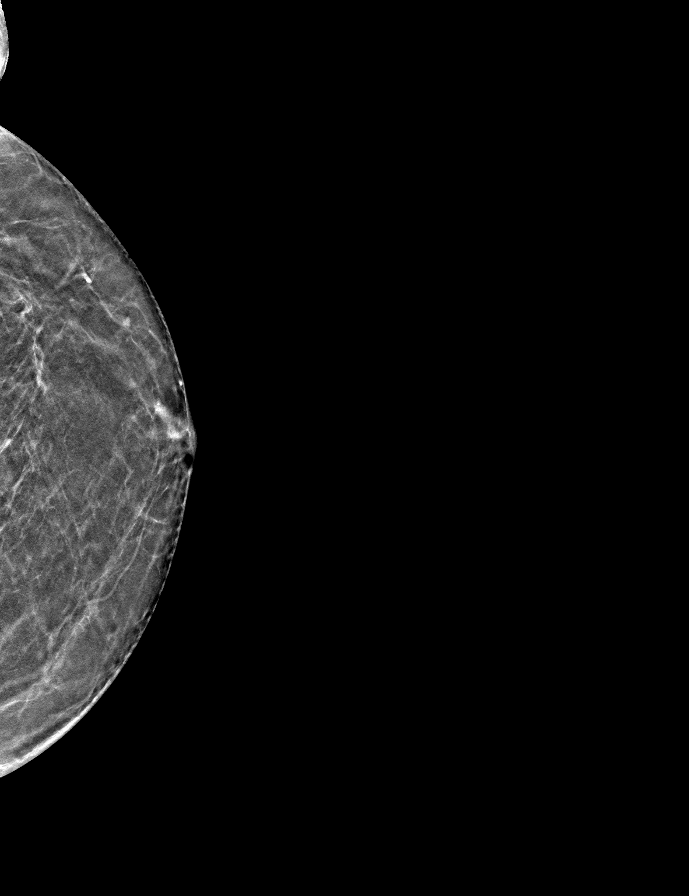

[L MLO tomo · tomo slice 25/50.0]
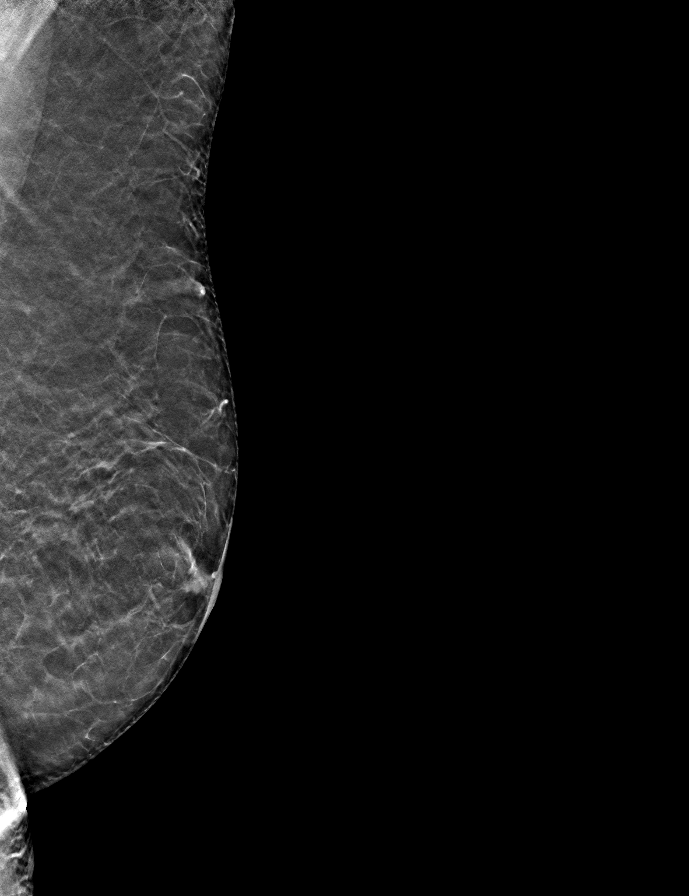

[R CC tomo · tomo slice 21/42.0]
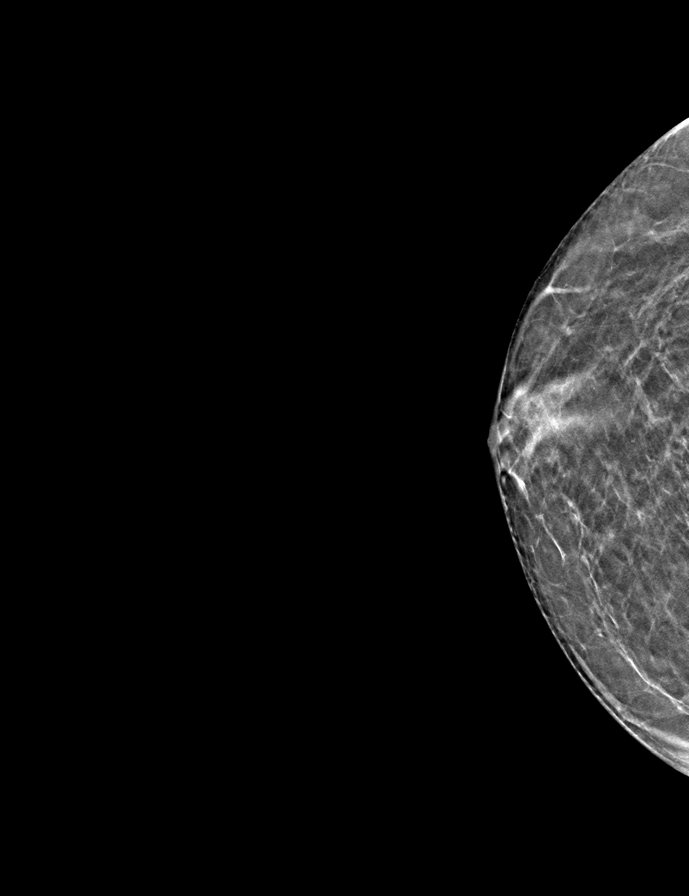

[R MLO tomo · tomo slice 23/46.0]
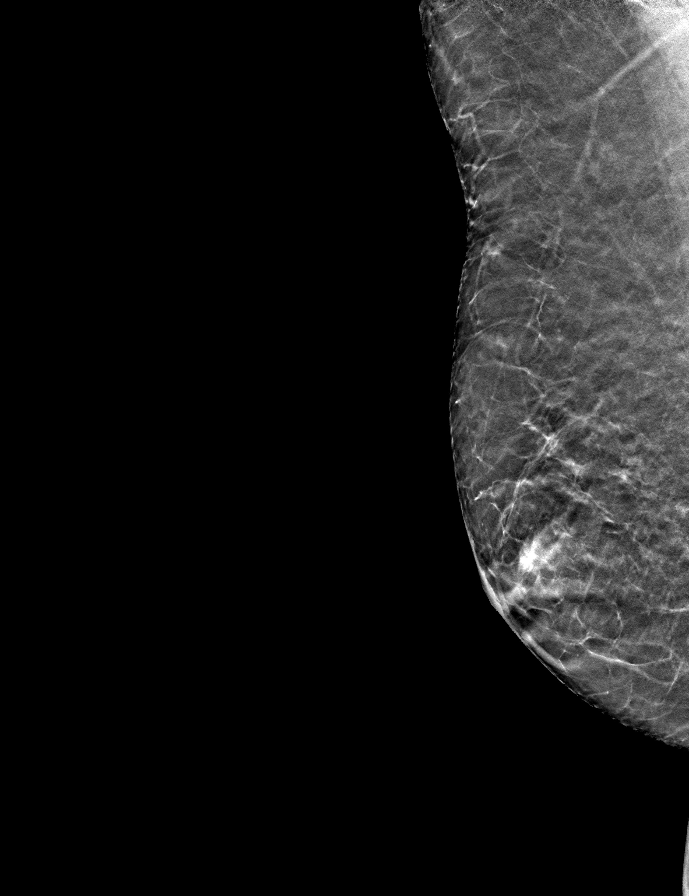

[9 of 24 positions shown; findings below may reference images not displayed]

FINDINGS: Asymmetric glandular tissue is identified right greater than left. No 
discrete mass seen. Findings thought to be consistent with gynecomastia. 
Sonography confirms glandular tissue without discrete mass seen.
IMPRESSION: Benign findings. BIR ADS 2.

## 2022-12-08 IMAGING — MR MRI LEFT WRIST WITHOUT CONTRAST
4 of 6 series · 17 of 40 positions shown · IV contrast (gadolinium)
Comparison: None.

________________________________________________________________________________________________ 
MRI LEFT WRIST WITHOUT CONTRAST, 12/08/2022 [DATE]: 
CLINICAL INDICATION: Other synovitis and tenosynovitis, left hand. Generalized 
left wrist pain for 2 months. Patient denies trauma. No relief with cortisone 
injection and oral steroids.
TECHNIQUE: Multiplanar, multiecho position MR images of the wrist were performed 
without intravenous gadolinium enhancement. Patient was scanned on a
magnet.

[Series 301: T1 · axial · left · 3.0mm · 0.14mm/px · z∈[-15,+48]mm · 3 of 25 slices shown]
[im 4/25]
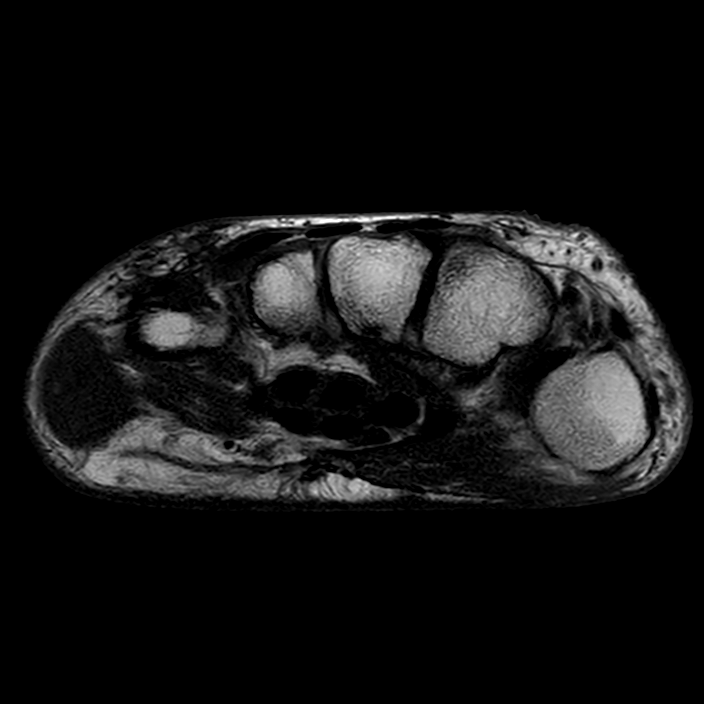
[im 13/25]
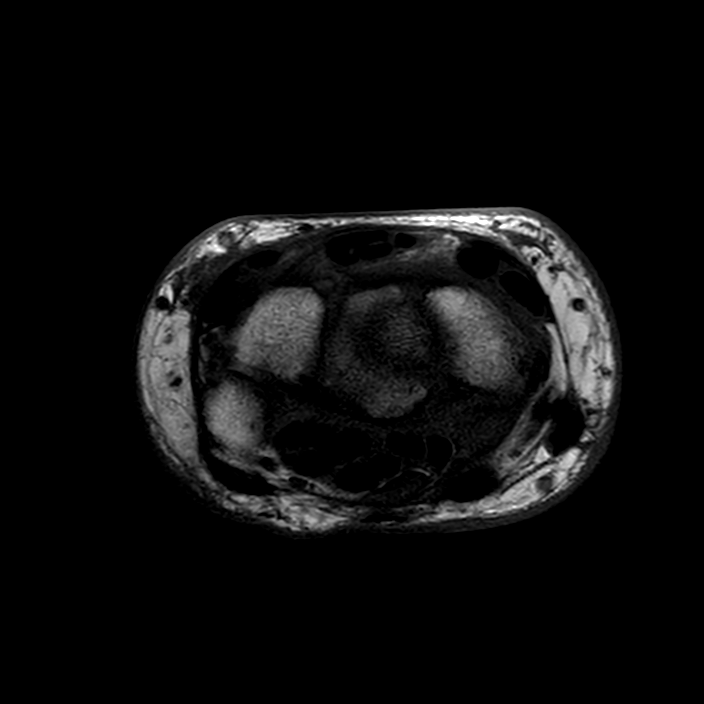
[im 22/25]
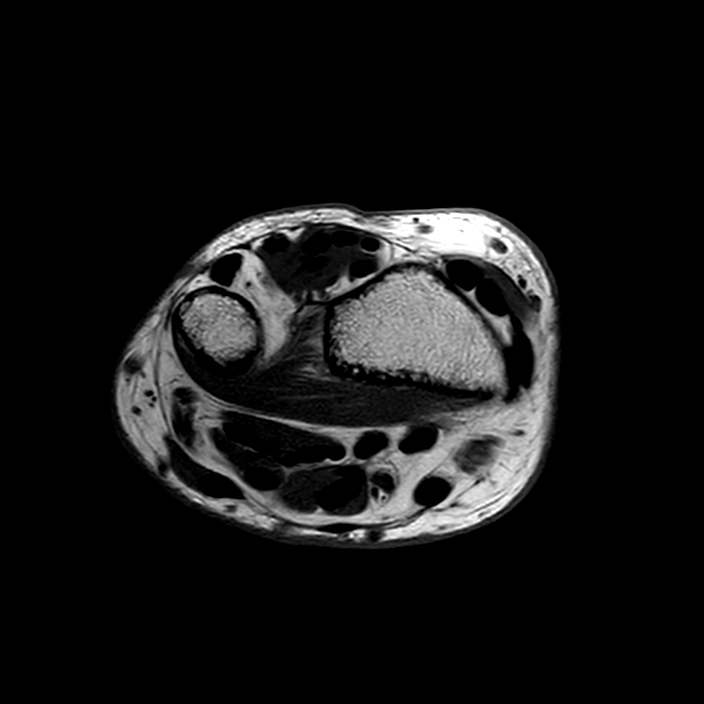

[Series 401: PD fat-sat · axial · left · 3.0mm · 0.19mm/px · z∈[-26,+48]mm · 8 of 25 slices shown (1 of 3)]
[im 1/25]
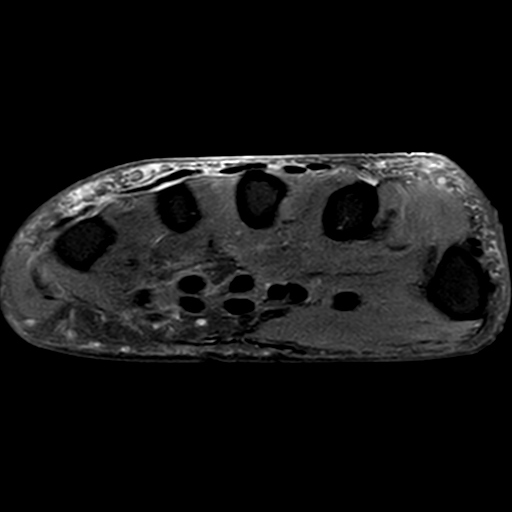
[im 4/25]
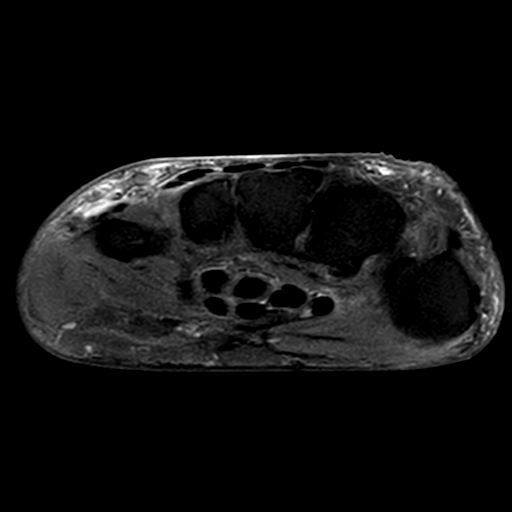
[im 7/25]
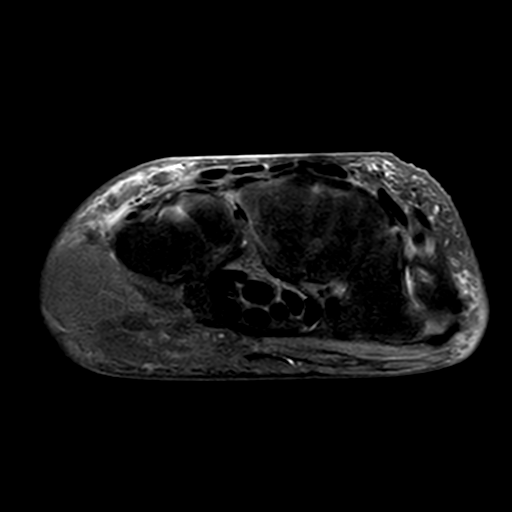
[im 10/25]
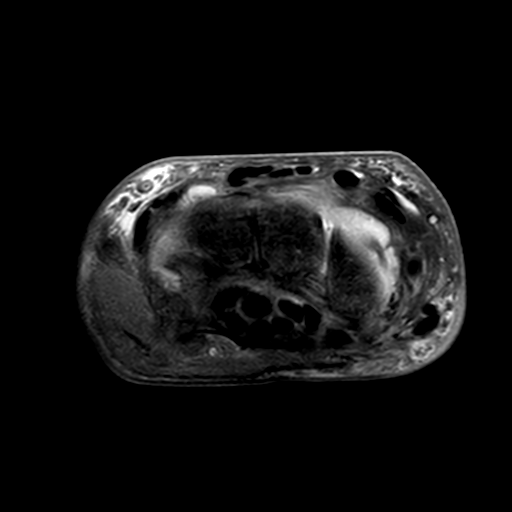
[im 13/25]
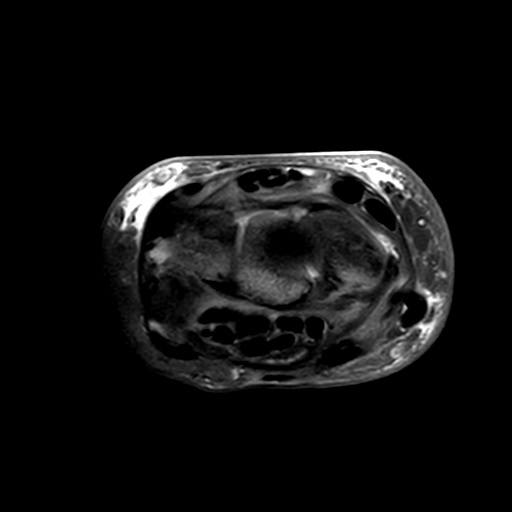
[im 16/25]
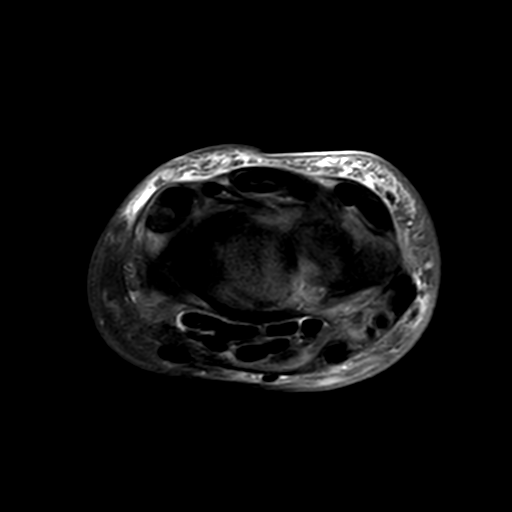
[im 19/25]
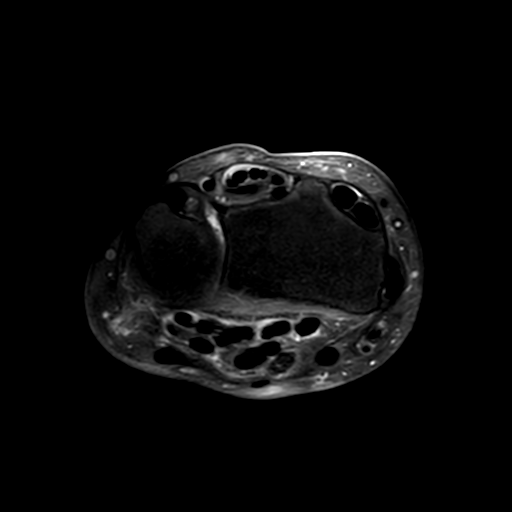
[im 22/25]
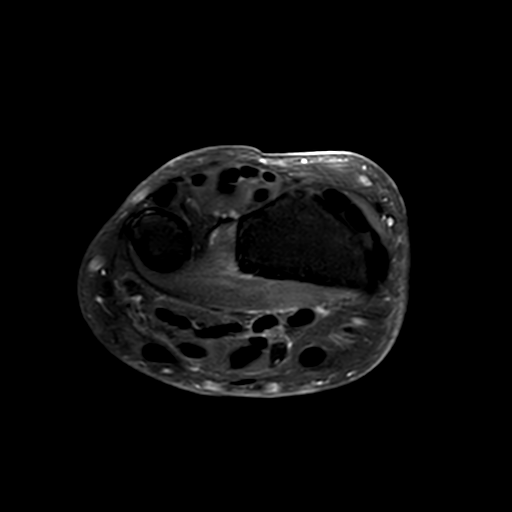

[Series 601: PD fat-sat · coronal · left · 2.5mm · 0.23mm/px · 3 of 17 slices shown (2 of 3)]
[im 4/17]
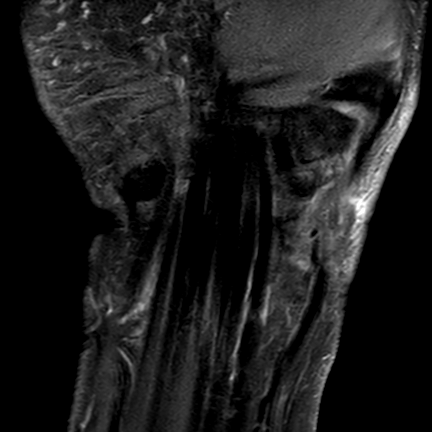
[im 10/17]
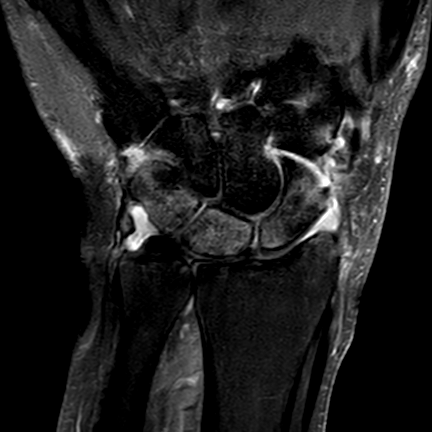
[im 17/17]
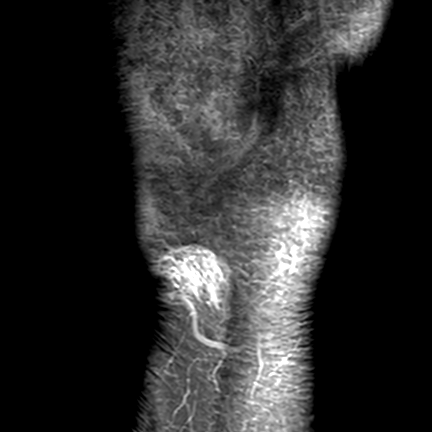

[Series 701: PD fat-sat · sagittal · left · 3.0mm · 0.23mm/px · 3 of 23 slices shown (3 of 3)]
[im 4/23]
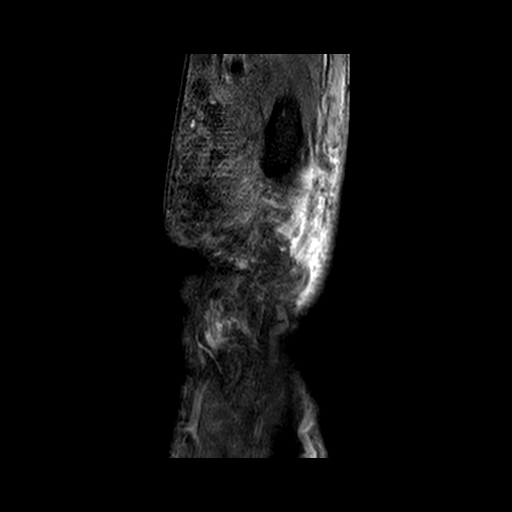
[im 13/23]
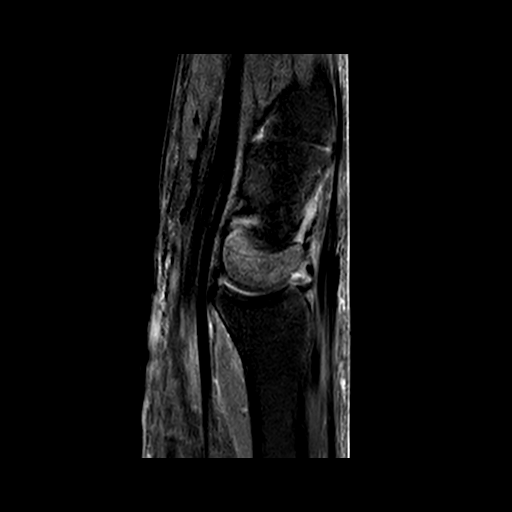
[im 19/23]
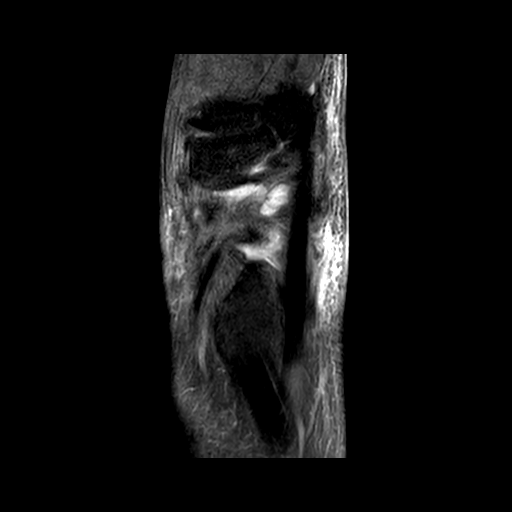

[17 of 40 positions shown; findings below may reference images not displayed]

FINDINGS: BONES AND JOINTS: Bone marrow edema in the scaphoid, lunate and triquetrum. No 
erosion or fracture.  Small radiocarpal and carpal joint effusions and mild 
synovitis. Mild degenerative change of the thumb carpometacarpal (EUE-C) joint 
and scaphotrapeziotrapezoid (STT) complex. Normal alignment. The distal ulna is 
well located within the radial sigmoid notch. 
LIGAMENTS: The scapholunate ligament is preserved. The lunotriquetral ligament 
is preserved. The central disc of the TFCC is preserved. The volar and dorsal 
components of the radioulnar ligament of the TFCC are intact. The ulnocarpal 
components of the TFCC are intact. The extrinsic ligaments are grossly intact.  
TENDONS: Mild multifocal flexor and extensor tenosynovitis. The flexor and 
extensor tendons are preserved. The flexor retinaculum is not bowed. 
SOFT TISSUES: Musculature is symmetric without mass, signal abnormality or 
atrophy. Neurovascular bundles are negative. Specifically, the ulnar nerve is 
negative at the level of Janpetter canal. No focal fluid collection or suggestion of 
ganglion. Mild subcutaneous soft tissue swelling.
IMPRESSION: 1.  Proximal carpal row bone marrow edema, small radiocarpal/carpal joint 
effusions and mild synovitis, and mild multifocal tenosynovitis. Findings are 
likely inflammatory. 
2.  Mild degenerative change of the thumb carpometacarpal (EUE-C) joint and 
scaphotrapeziotrapezoid (STT) complex.  
3.  Mild subcutaneous soft tissue swelling.

## 2023-07-11 DEATH — deceased
